# Patient Record
Sex: Female | Born: 1955
Health system: Southern US, Community
[De-identification: ages and names within clinical notes are randomized; demographics above are authoritative.]

## PROBLEM LIST (undated history)

## (undated) DIAGNOSIS — I10 Essential (primary) hypertension: Secondary | ICD-10-CM

## (undated) HISTORY — DX: Essential (primary) hypertension: I10

---

## 2007-01-23 ENCOUNTER — Ambulatory Visit: Payer: Self-pay | Admitting: Family Medicine

## 2007-01-24 ENCOUNTER — Encounter (INDEPENDENT_AMBULATORY_CARE_PROVIDER_SITE_OTHER): Payer: Self-pay | Admitting: Internal Medicine

## 2007-01-24 LAB — CONVERTED CEMR LAB
ALT: 10 units/L (ref 0–35)
AST: 16 units/L (ref 0–37)
Basophils Absolute: 0.1 10*3/uL (ref 0.0–0.1)
Basophils Relative: 1 % (ref 0–1)
Calcium: 10.1 mg/dL (ref 8.4–10.5)
Chloride: 109 meq/L (ref 96–112)
Creatinine, Ser: 0.83 mg/dL (ref 0.40–1.20)
MCHC: 32 g/dL (ref 30.0–36.0)
Monocytes Absolute: 0.4 10*3/uL (ref 0.1–1.0)
Neutro Abs: 2.7 10*3/uL (ref 1.7–7.7)
Neutrophils Relative %: 53 % (ref 43–77)
Platelets: 289 10*3/uL (ref 150–400)
Potassium: 5 meq/L (ref 3.5–5.3)
RDW: 13.2 % (ref 11.5–15.5)
Sodium: 144 meq/L (ref 135–145)
Total CHOL/HDL Ratio: 3.8

## 2007-01-25 ENCOUNTER — Ambulatory Visit (HOSPITAL_COMMUNITY): Admission: RE | Admit: 2007-01-25 | Discharge: 2007-01-25 | Payer: Self-pay | Admitting: Family Medicine

## 2007-02-03 ENCOUNTER — Ambulatory Visit: Payer: Self-pay | Admitting: *Deleted

## 2007-05-08 ENCOUNTER — Ambulatory Visit: Payer: Self-pay | Admitting: Internal Medicine

## 2008-01-15 ENCOUNTER — Ambulatory Visit: Payer: Self-pay | Admitting: Family Medicine

## 2008-01-26 ENCOUNTER — Ambulatory Visit (HOSPITAL_COMMUNITY): Admission: RE | Admit: 2008-01-26 | Discharge: 2008-01-26 | Payer: Self-pay | Admitting: Internal Medicine

## 2008-02-21 ENCOUNTER — Encounter (INDEPENDENT_AMBULATORY_CARE_PROVIDER_SITE_OTHER): Payer: Self-pay | Admitting: Adult Health

## 2008-02-21 ENCOUNTER — Ambulatory Visit: Payer: Self-pay | Admitting: Internal Medicine

## 2008-02-21 LAB — CONVERTED CEMR LAB
Albumin: 4.4 g/dL (ref 3.5–5.2)
BUN: 10 mg/dL (ref 6–23)
CO2: 22 meq/L (ref 19–32)
Calcium: 9.5 mg/dL (ref 8.4–10.5)
Chloride: 107 meq/L (ref 96–112)
Eosinophils Absolute: 0.2 10*3/uL (ref 0.0–0.7)
Glucose, Bld: 95 mg/dL (ref 70–99)
Lymphs Abs: 2.2 10*3/uL (ref 0.7–4.0)
MCV: 85.2 fL (ref 78.0–100.0)
Monocytes Relative: 7 % (ref 3–12)
Neutro Abs: 2.6 10*3/uL (ref 1.7–7.7)
Neutrophils Relative %: 49 % (ref 43–77)
Potassium: 4.3 meq/L (ref 3.5–5.3)
RBC: 4.47 M/uL (ref 3.87–5.11)
WBC: 5.4 10*3/uL (ref 4.0–10.5)

## 2008-12-17 ENCOUNTER — Ambulatory Visit: Payer: Self-pay | Admitting: Family Medicine

## 2013-02-01 ENCOUNTER — Emergency Department (HOSPITAL_COMMUNITY)
Admission: EM | Admit: 2013-02-01 | Discharge: 2013-02-01 | Disposition: A | Discharge status: Home / Self Care | Payer: No Typology Code available for payment source | Source: Home / Self Care | Attending: Family Medicine | Admitting: Family Medicine

## 2013-02-01 ENCOUNTER — Encounter (HOSPITAL_COMMUNITY): Payer: Self-pay | Admitting: Emergency Medicine

## 2013-02-01 DIAGNOSIS — R51 Headache: Secondary | ICD-10-CM

## 2013-02-01 DIAGNOSIS — IMO0001 Reserved for inherently not codable concepts without codable children: Secondary | ICD-10-CM

## 2013-02-01 DIAGNOSIS — I10 Essential (primary) hypertension: Secondary | ICD-10-CM

## 2013-02-01 DIAGNOSIS — Z0000 Encounter for general adult medical examination without abnormal findings: Secondary | ICD-10-CM

## 2013-02-01 DIAGNOSIS — R030 Elevated blood-pressure reading, without diagnosis of hypertension: Secondary | ICD-10-CM

## 2013-02-01 DIAGNOSIS — R519 Headache, unspecified: Secondary | ICD-10-CM

## 2013-02-01 LAB — POCT URINALYSIS DIP (DEVICE)
BILIRUBIN URINE: NEGATIVE
GLUCOSE, UA: NEGATIVE mg/dL
HGB URINE DIPSTICK: NEGATIVE
KETONES UR: NEGATIVE mg/dL
NITRITE: NEGATIVE
PH: 7 (ref 5.0–8.0)
Protein, ur: NEGATIVE mg/dL
SPECIFIC GRAVITY, URINE: 1.015 (ref 1.005–1.030)
Urobilinogen, UA: 0.2 mg/dL (ref 0.0–1.0)

## 2013-02-01 MED ORDER — HYDROCHLOROTHIAZIDE 25 MG PO TABS: 12.5000 mg | ORAL_TABLET | Freq: Every day | ORAL | Status: DC

## 2013-02-01 NOTE — ED Provider Notes (Signed)
CSN: [id number]     Arrival date & time 02/01/13  0840 History   First MD Initiated Contact with Patient 02/01/13 [phone]     Chief Complaint  Patient presents with  . Headache   (Consider location/radiation/quality/duration/timing/severity/associated sxs/prior Treatment) HPI Comments: Patient is asymptomatic at time of visit. Recently immigrated to the [place]. No PCP. Denies fever, corrective lens use, injuries, N/V, dizziness, changes in speech. Headaches only occur during daytime and never wake patient from sleep. Symptoms resolve completely with use of Advil.   Patient is a 58 y.o. female presenting with headaches. The history is provided by the patient and a relative. The history is limited by a language barrier. A language interpreter was used (family memeber).  Headache Pain location:  Frontal Quality: +throbbing. Radiates to:  Does not radiate Severity currently:  0/10 Onset quality:  Gradual Progression:  Waxing and waning Context: not activity, not exposure to bright light, not caffeine, not coughing, not defecating, not eating, not stress, not exposure to cold air, not intercourse, not loud noise and not straining   Relieved by:  NSAIDs Associated symptoms: blurred vision   Associated symptoms: no abdominal pain, no back pain, no congestion, no cough, no diarrhea, no dizziness, no drainage, no ear pain, no pain, no facial pain, no fatigue, no fever, no focal weakness, no hearing loss, no loss of balance, no myalgias, no nausea, no near-syncope, no neck pain, no neck stiffness, no numbness, no paresthesias, no photophobia, no seizures, no sinus pressure, no sore throat, no swollen glands, no syncope, no tingling, no URI, no visual change, no vomiting and no weakness     History reviewed. No pertinent past medical history. History reviewed. No pertinent past surgical history. No family history on file. History  Substance Use Topics  . Smoking status: Never Smoker   . Smokeless  tobacco: Not on file  . Alcohol Use: No   OB History   Grav Para Term Preterm Abortions TAB SAB Ect Mult Living                 Review of Systems  Constitutional: Negative for fever and fatigue.  HENT: Negative for congestion, ear pain, hearing loss, postnasal drip, sinus pressure and sore throat.   Eyes: Positive for blurred vision. Negative for photophobia and pain.  Respiratory: Negative for cough.   Cardiovascular: Negative for syncope and near-syncope.  Gastrointestinal: Negative for nausea, vomiting, abdominal pain and diarrhea.  Musculoskeletal: Negative for back pain, myalgias, neck pain and neck stiffness.  Neurological: Positive for headaches. Negative for dizziness, focal weakness, seizures, numbness, paresthesias and loss of balance.  All other systems reviewed and are negative.    Allergies  Review of patient's allergies indicates no known allergies.  Home Medications  No current outpatient prescriptions on file. BP 153/94  Pulse 86  Temp(Src) 98.2 F (36.8 C) (Oral)  Resp 16  SpO2 97% Physical Exam  Nursing note and vitals reviewed. Constitutional: She is oriented to person, place, and time. She appears well-developed and well-nourished. No distress.  HENT:  Head: Normocephalic and atraumatic.  Right Ear: Hearing, tympanic membrane, external ear and ear canal normal.  Left Ear: Hearing, tympanic membrane, external ear and ear canal normal.  Nose: Nose normal.  Mouth/Throat: Uvula is midline, oropharynx is clear and moist and mucous membranes are normal.  Eyes: Conjunctivae and EOM are normal. Pupils are equal, round, and reactive to light. Right eye exhibits discharge. Left eye exhibits no discharge. No scleral icterus.  Fundoscopic exam:  The right eye shows no arteriolar narrowing, no AV nicking, no exudate, no hemorrhage and no papilledema.       The left eye shows no AV nicking, no exudate, no hemorrhage and no papilledema.  Neck: Normal range of  motion. Neck supple. No thyromegaly present.  Cardiovascular: Normal rate, regular rhythm and normal heart sounds.   Pulmonary/Chest: Effort normal and breath sounds normal. No respiratory distress.  Abdominal: Soft. Bowel sounds are normal. She exhibits no distension. There is no tenderness.  Musculoskeletal: Normal range of motion.  Lymphadenopathy:    She has no cervical adenopathy.  Neurological: She is alert and oriented to person, place, and time. She has normal strength. No cranial nerve deficit. Coordination and gait normal. GCS eye subscore is 4. GCS verbal subscore is 5. GCS motor subscore is 6.  Skin: Skin is warm and dry. No rash noted.  Psychiatric: She has a normal mood and affect. Her behavior is normal.    ED Course  Procedures (including critical care time) Labs Review Labs Reviewed  POCT URINALYSIS DIP (DEVICE) - Abnormal; Notable for the following:    Leukocytes, UA SMALL (*)    All other components within normal limits   Imaging Review No results found.  EKG Interpretation    Date/Time:    Ventricular Rate:    PR Interval:    QRS Duration:   QT Interval:    QTC Calculation:   R Axis:     Text Interpretation:              MDM  Hx and exam suggest non-specific or tension type headache. Advised of elevated BP. Will provide limited Rx for HCTZ 12.5 mg QD and resource information for [facility] for follow up.     Jess Barters [place], [place] 02/01/13 1024  [address] [place], [place] 02/01/13 1254

## 2013-02-01 NOTE — ED Notes (Signed)
Pt c/o intermittent HA onset 1 month... HA will usually last for 2-3 days and after taking advil will feel better Also has blurry vision when she has the HA Denies: weakness, numbness, slurry speech  She is alert w/no signs of acute distress.

## 2013-02-02 NOTE — ED Provider Notes (Signed)
Medical screening examination/treatment/procedure(s) were performed by a resident physician or non-physician practitioner and as the supervising physician I was immediately available for consultation/collaboration.  Evan Corey, MD    Evan S Corey, MD 02/02/13 0752 

## 2013-02-20 ENCOUNTER — Encounter: Payer: Self-pay | Admitting: Internal Medicine

## 2013-02-20 ENCOUNTER — Ambulatory Visit: Payer: No Typology Code available for payment source | Attending: Internal Medicine | Admitting: Internal Medicine

## 2013-02-20 VITALS — BP 150/107 | HR 95 | Temp 98.7°F | Resp 14 | Ht 61.0 in | Wt 132.0 lb

## 2013-02-20 DIAGNOSIS — R51 Headache: Secondary | ICD-10-CM | POA: Insufficient documentation

## 2013-02-20 DIAGNOSIS — I10 Essential (primary) hypertension: Secondary | ICD-10-CM | POA: Insufficient documentation

## 2013-02-20 DIAGNOSIS — Z0000 Encounter for general adult medical examination without abnormal findings: Secondary | ICD-10-CM

## 2013-02-20 LAB — COMPLETE METABOLIC PANEL WITH GFR
ALBUMIN: 4.3 g/dL (ref 3.5–5.2)
ALT: 13 U/L (ref 0–35)
AST: 17 U/L (ref 0–37)
Alkaline Phosphatase: 55 U/L (ref 39–117)
BILIRUBIN TOTAL: 0.7 mg/dL (ref 0.2–1.2)
BUN: 11 mg/dL (ref 6–23)
CO2: 28 meq/L (ref 19–32)
Calcium: 9.4 mg/dL (ref 8.4–10.5)
Chloride: 106 mEq/L (ref 96–112)
Creat: 0.79 mg/dL (ref 0.50–1.10)
GFR, EST NON AFRICAN AMERICAN: 82 mL/min
GLUCOSE: 100 mg/dL — AB (ref 70–99)
POTASSIUM: 3.9 meq/L (ref 3.5–5.3)
SODIUM: 141 meq/L (ref 135–145)
TOTAL PROTEIN: 7.4 g/dL (ref 6.0–8.3)

## 2013-02-20 LAB — CBC WITH DIFFERENTIAL/PLATELET
Basophils Absolute: 0 10*3/uL (ref 0.0–0.1)
Basophils Relative: 1 % (ref 0–1)
EOS ABS: 0 10*3/uL (ref 0.0–0.7)
Eosinophils Relative: 1 % (ref 0–5)
HEMATOCRIT: 36.9 % (ref 36.0–46.0)
HEMOGLOBIN: 12.5 g/dL (ref 12.0–15.0)
LYMPHS ABS: 2 10*3/uL (ref 0.7–4.0)
Lymphocytes Relative: 43 % (ref 12–46)
MCH: 28.3 pg (ref 26.0–34.0)
MCHC: 33.9 g/dL (ref 30.0–36.0)
MCV: 83.7 fL (ref 78.0–100.0)
MONO ABS: 0.3 10*3/uL (ref 0.1–1.0)
MONOS PCT: 7 % (ref 3–12)
NEUTROS PCT: 48 % (ref 43–77)
Neutro Abs: 2.4 10*3/uL (ref 1.7–7.7)
Platelets: 312 10*3/uL (ref 150–400)
RBC: 4.41 MIL/uL (ref 3.87–5.11)
RDW: 13.5 % (ref 11.5–15.5)
WBC: 4.8 10*3/uL (ref 4.0–10.5)

## 2013-02-20 LAB — TSH: TSH: 2.283 u[IU]/mL (ref 0.350–4.500)

## 2013-02-20 MED ORDER — HYDROCHLOROTHIAZIDE 25 MG PO TABS: 25.0000 mg | ORAL_TABLET | Freq: Every day | ORAL | Status: DC

## 2013-02-20 NOTE — Progress Notes (Addendum)
Patient ID: Ashley Pena, female   DOB: 07-22-1955, 58 y.o.   MRN: [id number]   CC:  HPI: 58 year old female comes in to establish care, she has a history of hypertension, has not seen a physician in 4-5 years. Recently seen in the ER on 1/15 with frontal headaches, blood pressure was in the 150s. She was started on hydrochlorothiazide. Denies any headache today Poor communication due to language barrier. She denies any other medical problems She has had one C-section  Social history nonsmoker nonalcoholic Family history mother had fever of unknown origin as per the patient    No Known Allergies History reviewed. No pertinent past medical history. No current outpatient prescriptions on file prior to visit.   No current facility-administered medications on file prior to visit.   History reviewed. No pertinent family history. History   Social History  . Marital Status: Single    Spouse Name: N/A    Number of Children: N/A  . Years of Education: N/A   Occupational History  . Not on file.   Social History Main Topics  . Smoking status: Never Smoker   . Smokeless tobacco: Not on file  . Alcohol Use: No  . Drug Use: No  . Sexual Activity: Not on file   Other Topics Concern  . Not on file   Social History Narrative  . No narrative on file    Review of Systems  Constitutional: Negative for fever, chills, diaphoresis, activity change, appetite change and fatigue.  HENT: Negative for ear pain, nosebleeds, congestion, facial swelling, rhinorrhea, neck pain, neck stiffness and ear discharge.   Eyes: Negative for pain, discharge, redness, itching and visual disturbance.  Respiratory: Negative for cough, choking, chest tightness, shortness of breath, wheezing and stridor.   Cardiovascular: Negative for chest pain, palpitations and leg swelling.  Gastrointestinal: Negative for abdominal distention.  Genitourinary: Negative for dysuria, urgency, frequency, hematuria, flank pain,  decreased urine volume, difficulty urinating and dyspareunia.  Musculoskeletal: Negative for back pain, joint swelling, arthralgias and gait problem.  Neurological: Negative for dizziness, tremors, seizures, syncope, facial asymmetry, speech difficulty, weakness, light-headedness, numbness and headaches.  Hematological: Negative for adenopathy. Does not bruise/bleed easily.  Psychiatric/Behavioral: Negative for hallucinations, behavioral problems, confusion, dysphoric mood, decreased concentration and agitation.    Objective:   Filed Vitals:   02/20/13 0916  BP: 150/107  Pulse: 95  Temp: 98.7 F (37.1 C)  Resp: 14    Physical Exam  Constitutional: Appears well-developed and well-nourished. No distress.  HENT: Normocephalic. External right and left ear normal. Oropharynx is clear and moist.  Eyes: Conjunctivae and EOM are normal. PERRLA, no scleral icterus.  Neck: Normal ROM. Neck supple. No JVD. No tracheal deviation. No thyromegaly.  CVS: RRR, S1/S2 +, no murmurs, no gallops, no carotid bruit.  Pulmonary: Effort and breath sounds normal, no stridor, rhonchi, wheezes, rales.  Abdominal: Soft. BS +,  no distension, tenderness, rebound or guarding.  Musculoskeletal: Normal range of motion. No edema and no tenderness.  Lymphadenopathy: No lymphadenopathy noted, cervical, inguinal. Neuro: Alert. Normal reflexes, muscle tone coordination. No cranial nerve deficit. Skin: Skin is warm and dry. No rash noted. Not diaphoretic. No erythema. No pallor.  Psychiatric: Normal mood and affect. Behavior, judgment, thought content normal.   Lab Results  Component Value Date   WBC 5.4 02/21/2008   HGB 12.3 02/21/2008   HCT 38.1 02/21/2008   MCV 85.2 02/21/2008   PLT 259 02/21/2008   Lab Results  Component Value Date   CREATININE  0.73 02/21/2008   BUN 10 02/21/2008   NA 142 02/21/2008   K 4.3 02/21/2008   CL 107 02/21/2008   CO2 22 02/21/2008    No results found for this basename: HGBA1C   Lipid Panel      Component Value Date/Time   CHOL 211* 01/24/2007 0102   TRIG 122 01/24/2007 0102   HDL 55 01/24/2007 0102   CHOLHDL 3.8 Ratio 01/24/2007 0102   VLDL 24 01/24/2007 0102   LDLCALC 132* 01/24/2007 0102       Assessment and plan:   There are no active problems to display for this patient.      Hypertension Increase HCTZ to 25 mg Obtain baseline labs, renal panel   Headache No focal symptoms therefore  No neuro imaging is being obtained, likely secondary to hypertension   Establish care Last colonoscopy reportedly was in 2005 Gastroenterology referral her for colonoscopy Obtain baseline labs Gynecology referral for a Pap smear  Follow up in 3 months    The patient was given clear instructions to go to ER or return to medical center if symptoms don't improve, worsen or new problems develop. The patient verbalized understanding. The patient was told to call to get any lab results if not heard anything in the next week.

## 2013-02-20 NOTE — Progress Notes (Signed)
Pt is here to establish care. Pt suffers from hypertension. Complains of throbbing headaches x2 month. Takes Advil for pain.

## 2013-02-21 LAB — VITAMIN D 25 HYDROXY (VIT D DEFICIENCY, FRACTURES): Vit D, 25-Hydroxy: 36 ng/mL (ref 30–89)

## 2013-02-22 ENCOUNTER — Telehealth: Payer: Self-pay | Admitting: *Deleted

## 2013-02-22 NOTE — Telephone Encounter (Signed)
Unable to leave pt a voicemail. No answer.

## 2013-02-22 NOTE — Telephone Encounter (Signed)
Message copied by Gazella Anglin, [place] on Thu Feb 22, 2013  3:44 PM ------      Message from: Susie CassetteABROL MD, [facility]      Created: Wed Feb 21, 2013  9:57 AM       Notify patient of the labs are normal ------

## 2013-04-12 ENCOUNTER — Ambulatory Visit
Admission: RE | Admit: 2013-04-12 | Discharge: 2013-04-12 | Disposition: A | Discharge status: Home / Self Care | Payer: No Typology Code available for payment source | Source: Ambulatory Visit | Attending: Internal Medicine | Admitting: Internal Medicine

## 2013-04-12 ENCOUNTER — Ambulatory Visit: Payer: Self-pay

## 2013-04-12 ENCOUNTER — Telehealth: Payer: Self-pay | Admitting: Emergency Medicine

## 2013-04-12 DIAGNOSIS — I10 Essential (primary) hypertension: Secondary | ICD-10-CM

## 2013-04-12 DIAGNOSIS — Z0000 Encounter for general adult medical examination without abnormal findings: Secondary | ICD-10-CM

## 2013-04-12 NOTE — Telephone Encounter (Signed)
Pt daughter per interpretor given mammogram results. Verbalized understanding

## 2013-04-12 NOTE — Telephone Encounter (Signed)
Message copied by Darlis LoanSMITH, Alleene Stoy D on Thu Apr 12, 2013  4:53 PM ------      Message from: Susie CassetteABROL MD, Germain OsgoodNAYANA      Created: Thu Apr 12, 2013  3:53 PM       No mammographic evidence of malignancy ------

## 2013-05-21 ENCOUNTER — Ambulatory Visit: Payer: Self-pay | Admitting: Internal Medicine

## 2013-06-01 ENCOUNTER — Ambulatory Visit: Payer: Self-pay | Attending: Internal Medicine | Admitting: Internal Medicine

## 2013-06-01 ENCOUNTER — Encounter: Payer: Self-pay | Admitting: Internal Medicine

## 2013-06-01 VITALS — BP 133/78 | HR 80 | Temp 98.2°F | Resp 16 | Wt 131.0 lb

## 2013-06-01 DIAGNOSIS — Z79899 Other long term (current) drug therapy: Secondary | ICD-10-CM | POA: Insufficient documentation

## 2013-06-01 DIAGNOSIS — I10 Essential (primary) hypertension: Secondary | ICD-10-CM

## 2013-06-01 MED ORDER — HYDROCHLOROTHIAZIDE 25 MG PO TABS: 25.0000 mg | ORAL_TABLET | Freq: Every day | ORAL | Status: DC

## 2013-06-01 NOTE — Progress Notes (Signed)
Patient ID: Ashley Pena, female   DOB: 11-25-1955, 58 y.o.   MRN: [id number]  CC: F/u of HTN  HPI:  Patient presents to clinic today for a routine follow up of her hypertension.  Patient is accompanied by her son for minor interpretation.  Patient reports that she was unaware that she needed to continue taking HCTZ after the medication ran out.  No one explained that this will be a long term medication and she will need refills.  Patient reports that she understands now and will continue with medication management.   No Known Allergies Past Medical History  Diagnosis Date  . Hypertension    Current Outpatient Prescriptions on File Prior to Visit  Medication Sig Dispense Refill  . hydrochlorothiazide (HYDRODIURIL) 25 MG tablet Take 1 tablet (25 mg total) by mouth daily.  30 tablet  3   No current facility-administered medications on file prior to visit.   History reviewed. No pertinent family history. History   Social History  . Marital Status: Single    Spouse Name: N/A    Number of Children: N/A  . Years of Education: N/A   Occupational History  . Not on file.   Social History Main Topics  . Smoking status: Never Smoker   . Smokeless tobacco: Not on file  . Alcohol Use: No  . Drug Use: No  . Sexual Activity: Not on file   Other Topics Concern  . Not on file   Social History Narrative  . No narrative on file   Review of Systems  HENT: Negative.   Eyes: Negative.   Respiratory: Negative.   Cardiovascular: Negative.   Gastrointestinal: Negative.       Objective:   Filed Vitals:   06/01/13 1006  BP: 133/78  Pulse: 80  Temp: 98.2 F (36.8 C)  Resp: 16   Physical Exam  Constitutional: She appears well-developed and well-nourished.  Eyes: Pupils are equal, round, and reactive to light. No scleral icterus.  Neck: Normal range of motion. Neck supple. No JVD present.  Cardiovascular: Normal rate, regular rhythm, normal heart sounds and intact distal pulses.    Pulmonary/Chest: Effort normal and breath sounds normal.  Abdominal: Soft. Bowel sounds are normal.  Musculoskeletal: She exhibits no edema and no tenderness.  Lymphadenopathy:    She has no cervical adenopathy.  Skin: Skin is warm and dry.  Psychiatric: She has a normal mood and affect. Her behavior is normal.     Lab Results  Component Value Date   WBC 4.8 02/20/2013   HGB 12.5 02/20/2013   HCT 36.9 02/20/2013   MCV 83.7 02/20/2013   PLT 312 02/20/2013   Lab Results  Component Value Date   CREATININE 0.79 02/20/2013   BUN 11 02/20/2013   NA 141 02/20/2013   K 3.9 02/20/2013   CL 106 02/20/2013   CO2 28 02/20/2013    No results found for this basename: HGBA1C   Lipid Panel     Component Value Date/Time   CHOL 211* 01/24/2007 0102   TRIG 122 01/24/2007 0102   HDL 55 01/24/2007 0102   CHOLHDL 3.8 Ratio 01/24/2007 0102   VLDL 24 01/24/2007 0102   LDLCALC 132* 01/24/2007 0102       Assessment and plan:   Ashley Pena was seen today for follow-up and hypertension.  Diagnoses and associated orders for this visit:  HTN (hypertension) Continue current regimen and dash diet - hydrochlorothiazide (HYDRODIURIL) 25 MG tablet; Take 1 tablet (25 mg total) by mouth  daily.   Return in about 3 months (around 09/01/2013) for htn.   Holland CommonsValerie Keck, NP-C [facility] [phone] 06/01/2013, 10:30 AM

## 2013-06-01 NOTE — Patient Instructions (Signed)
Hypertension  As your heart beats, it forces blood through your arteries. This force is your blood pressure. If the pressure is too high, it is called hypertension (HTN) or high blood pressure. HTN is dangerous because you may have it and not know it. High blood pressure may mean that your heart has to work harder to pump blood. Your arteries may be narrow or stiff. The extra work puts you at risk for heart disease, stroke, and other problems.   Blood pressure consists of two numbers, a higher number over a lower, 110/72, for example. It is stated as "110 over 72." The ideal is below 120 for the top number (systolic) and under 80 for the bottom (diastolic). Write down your blood pressure today.  You should pay close attention to your blood pressure if you have certain conditions such as:   Heart failure.   Prior heart attack.   Diabetes   Chronic kidney disease.   Prior stroke.   Multiple risk factors for heart disease.  To see if you have HTN, your blood pressure should be measured while you are seated with your arm held at the level of the heart. It should be measured at least twice. A one-time elevated blood pressure reading (especially in the Emergency Department) does not mean that you need treatment. There may be conditions in which the blood pressure is different between your right and left arms. It is important to see your caregiver soon for a recheck.  Most people have essential hypertension which means that there is not a specific cause. This type of high blood pressure may be lowered by changing lifestyle factors such as:   Stress.   Smoking.   Lack of exercise.   Excessive weight.   Drug/tobacco/alcohol use.   Eating less salt.  Most people do not have symptoms from high blood pressure until it has caused damage to the body. Effective treatment can often prevent, delay or reduce that damage.  TREATMENT    When a cause has been identified, treatment for high blood pressure is directed at the cause. There are a large number of medications to treat HTN. These fall into several categories, and your caregiver will help you select the medicines that are best for you. Medications may have side effects. You should review side effects with your caregiver.  If your blood pressure stays high after you have made lifestyle changes or started on medicines,    Your medication(s) may need to be changed.   Other problems may need to be addressed.   Be certain you understand your prescriptions, and know how and when to take your medicine.   Be sure to follow up with your caregiver within the time frame advised (usually within two weeks) to have your blood pressure rechecked and to review your medications.   If you are taking more than one medicine to lower your blood pressure, make sure you know how and at what times they should be taken. Taking two medicines at the same time can result in blood pressure that is too low.  SEEK IMMEDIATE MEDICAL CARE IF:   You develop a severe headache, blurred or changing vision, or confusion.   You have unusual weakness or numbness, or a faint feeling.   You have severe chest or abdominal pain, vomiting, or breathing problems.  MAKE SURE YOU:    Understand these instructions.   Will watch your condition.   Will get help right away if you are not doing well   or get worse.  Document Released: 01/04/2005 Document Revised: 03/29/2011 Document Reviewed: 08/25/2007  ExitCare Patient Information 2014 [employer].  DASH Diet   The DASH diet stands for "Dietary Approaches to Stop Hypertension." It is a healthy eating plan that has been shown to reduce high blood pressure (hypertension) in as little as 14 days, while also possibly providing other significant health benefits. These other health benefits include reducing the risk of breast cancer after menopause and reducing the risk of type 2 diabetes, heart disease, colon cancer, and stroke. Health benefits also include weight loss and slowing kidney failure in patients with chronic kidney disease.   DIET GUIDELINES   Limit salt (sodium). Your diet should contain less than 1500 mg of sodium daily.   Limit refined or processed carbohydrates. Your diet should include mostly whole grains. Desserts and added sugars should be used sparingly.   Include small amounts of heart-healthy fats. These types of fats include nuts, oils, and tub margarine. Limit saturated and trans fats. These fats have been shown to be harmful in the body.  CHOOSING FOODS   The following food groups are based on a 2000 calorie diet. See your Registered Dietitian for individual calorie needs.  Grains and Grain Products (6 to 8 servings daily)   Eat More Often: Whole-wheat bread, brown rice, whole-grain or wheat pasta, quinoa, popcorn without added fat or salt (air popped).   Eat Less Often: White bread, white pasta, white rice, cornbread.  Vegetables (4 to 5 servings daily)   Eat More Often: Fresh, frozen, and canned vegetables. Vegetables may be raw, steamed, roasted, or grilled with a minimal amount of fat.   Eat Less Often/Avoid: Creamed or fried vegetables. Vegetables in a cheese sauce.  Fruit (4 to 5 servings daily)   Eat More Often: All fresh, canned (in natural juice), or frozen fruits. Dried fruits without added sugar. One hundred percent fruit juice ( cup [237 mL] daily).   Eat Less Often: Dried fruits with added sugar. Canned fruit in light or heavy syrup.   Lean Meats, Fish, and Poultry (2 servings or less daily. One serving is 3 to 4 oz [85-114 g]).   Eat More Often: Ninety percent or leaner ground beef, tenderloin, sirloin. Round cuts of beef, chicken breast, turkey breast. All fish. Grill, bake, or broil your meat. Nothing should be fried.   Eat Less Often/Avoid: Fatty cuts of meat, turkey, or chicken leg, thigh, or wing. Fried cuts of meat or fish.  Dairy (2 to 3 servings)   Eat More Often: Low-fat or fat-free milk, low-fat plain or light yogurt, reduced-fat or part-skim cheese.   Eat Less Often/Avoid: Milk (whole, 2%).Whole milk yogurt. Full-fat cheeses.  Nuts, Seeds, and Legumes (4 to 5 servings per week)   Eat More Often: All without added salt.   Eat Less Often/Avoid: Salted nuts and seeds, canned beans with added salt.  Fats and Sweets (limited)   Eat More Often: Vegetable oils, tub margarines without trans fats, sugar-free gelatin. Mayonnaise and salad dressings.   Eat Less Often/Avoid: Coconut oils, palm oils, butter, stick margarine, cream, half and half, cookies, candy, pie.  FOR MORE INFORMATION  The Dash Diet Eating Plan: www.dashdiet.org  Document Released: 12/24/2010 Document Revised: 03/29/2011 Document Reviewed: 12/24/2010  ExitCare Patient Information 2014 [employer].

## 2013-06-01 NOTE — Progress Notes (Signed)
Patient is here to F/U for HTN States she has not taken her BP med in 3 weeks. Sates she didn't know she was to continue it. She and son re-educated on importance of continuing med. Both verbalize understanding. Son is present to asist with interpretation.

## 2014-10-09 ENCOUNTER — Ambulatory Visit: Payer: Self-pay | Attending: Internal Medicine | Admitting: Internal Medicine

## 2014-10-09 ENCOUNTER — Encounter: Payer: Self-pay | Admitting: Internal Medicine

## 2014-10-09 VITALS — BP 168/93 | HR 87 | Temp 98.0°F | Resp 16 | Ht 61.0 in | Wt 123.6 lb

## 2014-10-09 DIAGNOSIS — I10 Essential (primary) hypertension: Secondary | ICD-10-CM | POA: Insufficient documentation

## 2014-10-09 DIAGNOSIS — Z23 Encounter for immunization: Secondary | ICD-10-CM | POA: Insufficient documentation

## 2014-10-09 MED ORDER — HYDROCHLOROTHIAZIDE 25 MG PO TABS: 25.0000 mg | ORAL_TABLET | Freq: Every day | ORAL | Status: DC

## 2014-10-09 NOTE — Progress Notes (Signed)
Patient states she is here for a check up Patient presents in office with elevated blood pressure Patient stated she used to take medications for HTN but does not Take it any more

## 2014-10-09 NOTE — Progress Notes (Signed)
Patient ID: Ashley Pena, female   DOB: 1955/09/18, 60 y.o.   MRN: [id number] Subjective:  Ashley Pena is a 59 y.o. female with hypertension. Current Outpatient Prescriptions  Medication Sig Dispense Refill  . ibuprofen (ADVIL,MOTRIN) 200 MG tablet Take 200 mg by mouth every 6 (six) hours as needed for moderate pain.    . hydrochlorothiazide (HYDRODIURIL) 25 MG tablet Take 1 tablet (25 mg total) by mouth daily. (Patient not taking: Reported on 10/09/2014) 30 tablet 3   No current facility-administered medications for this visit.    Hypertension ROS: taking medications as instructed, no medication side effects noted, no TIA's, no chest pain on exertion, no dyspnea on exertion, no swelling of ankles and no palpitations.   Objective:  BP 168/93 mmHg  Pulse 87  Temp(Src) 98 F (36.7 C)  Resp 16  Ht  (1.549 m)  Wt 123 lb 9.6 oz (56.065 kg)  BMI 23.37 kg/m2  SpO2 100%  Appearance alert, well appearing, and in no distress, oriented to person, place, and time and normal appearing weight. General exam BP noted to be mildly elevated today in office, S1, S2 normal, no gallop, no murmur, chest clear, no JVD, no HSM, no edema.  Lab review: orders written for new lab studies as appropriate; see orders.   Assessment:   Hypertension needs improvement and patient poorly compliant.  Need influenza vaccine: give today  Plan:  Recommended sodium restriction. Copy of written low fat low cholesterol diet provided and reviewed. Reviewed medications and side effects in detail.    Return in about 4 weeks (around 11/06/2014) for Nurse Visit-BP check/Lab visit and 3 mo PCP HTN .    [name], NP 10/09/2014 3:45 PM

## 2015-06-30 ENCOUNTER — Emergency Department (HOSPITAL_COMMUNITY)
Admission: EM | Admit: 2015-06-30 | Discharge: 2015-06-30 | Disposition: A | Discharge status: Home / Self Care | Payer: Self-pay | Attending: Emergency Medicine | Admitting: Emergency Medicine

## 2015-06-30 ENCOUNTER — Emergency Department (HOSPITAL_COMMUNITY): Payer: Self-pay

## 2015-06-30 ENCOUNTER — Encounter (HOSPITAL_COMMUNITY): Payer: Self-pay | Admitting: Vascular Surgery

## 2015-06-30 DIAGNOSIS — R51 Headache: Secondary | ICD-10-CM | POA: Insufficient documentation

## 2015-06-30 DIAGNOSIS — Z79899 Other long term (current) drug therapy: Secondary | ICD-10-CM | POA: Insufficient documentation

## 2015-06-30 DIAGNOSIS — R519 Headache, unspecified: Secondary | ICD-10-CM

## 2015-06-30 DIAGNOSIS — I10 Essential (primary) hypertension: Secondary | ICD-10-CM | POA: Insufficient documentation

## 2015-06-30 MED ORDER — METOCLOPRAMIDE HCL 5 MG/ML IJ SOLN
10.0000 mg | Freq: Once | INTRAMUSCULAR | Status: AC
  Administered 2015-06-30: 10 mg via INTRAVENOUS
  Filled 2015-06-30: qty 2

## 2015-06-30 MED ORDER — DIPHENHYDRAMINE HCL 50 MG/ML IJ SOLN
25.0000 mg | Freq: Once | INTRAMUSCULAR | Status: AC
  Administered 2015-06-30: 25 mg via INTRAVENOUS
  Filled 2015-06-30: qty 1

## 2015-06-30 MED ORDER — DEXAMETHASONE SODIUM PHOSPHATE 10 MG/ML IJ SOLN
10.0000 mg | Freq: Once | INTRAMUSCULAR | Status: AC
  Administered 2015-06-30: 10 mg via INTRAVENOUS
  Filled 2015-06-30: qty 1

## 2015-06-30 NOTE — Discharge Instructions (Signed)

## 2015-06-30 NOTE — ED Provider Notes (Signed)
CSN: [id number]     Arrival date & time 06/30/15  1213 History   First MD Initiated Contact with Patient 06/30/15 1627     Patient interviewed with family at bedside who provided an assisted with communication. She declines formal interpreter.  Chief Complaint  Patient presents with  . Headache   Patient is a 60 y.o. female presenting with headaches.  Headache Pain location:  Generalized Quality:  Dull Radiates to:  Does not radiate Severity currently:  10/10 Severity at highest:  10/10 Onset quality:  Gradual Duration:  3 days Timing:  Constant Progression:  Worsening Chronicity:  Recurrent Similar to prior headaches: yes   Context: not activity, not exposure to bright light, not coughing, not eating, not stress and not loud noise   Relieved by:  Nothing Worsened by:  Nothing Ineffective treatments:  Acetaminophen Associated symptoms: facial pain   Associated symptoms: no abdominal pain, no back pain, no blurred vision, no congestion, no cough, no diarrhea, no dizziness, no ear pain, no fever, no near-syncope, no neck pain, no neck stiffness, no numbness, no paresthesias, no photophobia, no seizures, no sore throat, no swollen glands, no tingling, no URI, no visual change, no vomiting and no weakness      Past Medical History  Diagnosis Date  . Hypertension    Past Surgical History  Procedure Laterality Date  . Cesarean section     No family history on file. Social History  Substance Use Topics  . Smoking status: Never Smoker   . Smokeless tobacco: None  . Alcohol Use: No   OB History    No data available     Review of Systems  Constitutional: Negative for fever.  HENT: Negative for congestion, ear pain and sore throat.   Eyes: Negative for blurred vision and photophobia.  Respiratory: Negative for cough.   Cardiovascular: Negative for chest pain and near-syncope.  Gastrointestinal: Negative for vomiting, abdominal pain and diarrhea.  Musculoskeletal: Negative  for back pain, neck pain and neck stiffness.  Allergic/Immunologic: Negative for immunocompromised state.  Neurological: Positive for headaches. Negative for dizziness, seizures, weakness, numbness and paresthesias.  All other systems reviewed and are negative.     Allergies  Review of patient's allergies indicates no known allergies.  Home Medications   Prior to Admission medications   Medication Sig Start Date End Date Taking? Authorizing Provider  hydrochlorothiazide (HYDRODIURIL) 25 MG tablet Take 1 tablet (25 mg total) by mouth daily. 10/09/14   Ambrose FinlandValerie A Keck, NP  ibuprofen (ADVIL,MOTRIN) 200 MG tablet Take 200 mg by mouth every 6 (six) hours as needed for moderate pain.    Historical Provider, MD   BP 138/104 mmHg  Pulse 109  Temp(Src) 98.7 F (37.1 C) (Oral)  Resp 20  SpO2 98% Physical Exam  Constitutional: She is oriented to person, place, and time. She appears well-developed and well-nourished. No distress.  HENT:  Head: Normocephalic and atraumatic.  Right Ear: External ear normal.  Left Ear: External ear normal.  Nose: Nose normal.  Mouth/Throat: Oropharynx is clear and moist. No oropharyngeal exudate.  Eyes: Conjunctivae and EOM are normal. Right eye exhibits no discharge. Left eye exhibits no discharge. No scleral icterus.  Neck: Normal range of motion. Neck supple.  Cardiovascular: Normal rate, regular rhythm, normal heart sounds and intact distal pulses.   Pulmonary/Chest: Effort normal and breath sounds normal. No respiratory distress. She exhibits no tenderness.  Abdominal: Soft. Bowel sounds are normal. She exhibits no distension and no mass. There is  no tenderness. There is no rebound and no guarding.  Musculoskeletal: She exhibits no edema.  Lymphadenopathy:    She has no cervical adenopathy.  Neurological: She is alert and oriented to person, place, and time. No cranial nerve deficit. She exhibits normal muscle tone. Coordination normal.  Skin: Skin is  warm. No rash noted.  Psychiatric: She has a normal mood and affect.  Nursing note and vitals reviewed. Ambulates without difficulty, negative Romberg, 5 out of 5 strength and sensation on all 5 extremities, normal finger to nose  ED Course  Procedures (including critical care time) Labs Review Labs Reviewed - No data to display  Imaging Review Ct Head Wo Contrast  06/30/2015  CLINICAL DATA:  60 year old female with headache for 3 days. Light and sound sensitive. Reports symptoms similar to prior migraines. Initial encounter. EXAM: CT HEAD WITHOUT CONTRAST TECHNIQUE: Contiguous axial images were obtained from the base of the skull through the vertex without intravenous contrast. COMPARISON:  None. FINDINGS: Visible paranasal sinuses and mastoids are clear. Visible skull is within normal limits. Visualized orbit soft tissues are within normal limits. Visualized scalp soft tissues are within normal limits. Mild Calcified atherosclerosis at the skull base. Cerebral volume is within normal limits for age. No midline shift, ventriculomegaly, mass effect, evidence of mass lesion, intracranial hemorrhage or evidence of cortically based acute infarction. Gray-white matter differentiation is within normal limits throughout the brain. There is also calcified atherosclerosis of the right M1 MCA segment. No suspicious intracranial vascular hyperdensity. IMPRESSION: Negative for age noncontrast CT appearance of the brain. Electronically Signed   By: Odessa Fleming M.D.   On: 06/30/2015 17:30   I have personally reviewed and evaluated these images and lab results as part of my medical decision-making.   EKG Interpretation None      MDM   Final diagnoses:  Acute nonintractable headache, unspecified headache type     Doubt subarachnoid hemorrhage given the prolonged course, not thunderclap. Patient denies any fevers, no neck stiffness, doubt meningitis. No confusion to suggest encephalopathy. Patient denies any  recent head trauma. Nonfocal neurological exam and patient able to ambulate well, doubt posterior stroke. CT head negative for acute abnormality. Patient treated with a migraine cocktail and felt significantly better. Patient discharged home with follow-up with primary care provider. Return for worsening headache, thunderclap presentation, intractable vomiting, fevers with neck stiffness, or other concerning symptoms. Patient discharged in good condition.   Sidney Ace, MD 06/30/15 [id number]  Sidney Ace, MD 06/30/15 1191  Zadie Rhine, MD 07/01/15 [phone]

## 2015-06-30 NOTE — ED Notes (Signed)
Pt reports to the ED for eval of HA x 3 days. Pt reports light and sound make the HA worse. She has hx of migraines and reports this feels similar. Pt denies any N/V. Pt A&Ox4, resp e/u, and skin warm and dry.

## 2015-06-30 NOTE — ED Provider Notes (Signed)
Patient seen/examined in the Emergency Department in conjunction with Resident Physician Provider Ruch Patient reports HA   Exam : awake/alert, no distress, interactive, no arm drift, no facial droop Plan: ct head negative Will need to be ambulated Will also need further discussion with language line as English is a second language for patient    Zadie Rhineonald Taleyah Hillman, MD 06/30/15 1737

## 2015-10-20 ENCOUNTER — Other Ambulatory Visit: Payer: Self-pay | Admitting: Internal Medicine

## 2015-10-20 ENCOUNTER — Ambulatory Visit: Payer: Self-pay | Attending: Internal Medicine | Admitting: Internal Medicine

## 2015-10-20 ENCOUNTER — Encounter: Payer: Self-pay | Admitting: Internal Medicine

## 2015-10-20 ENCOUNTER — Ambulatory Visit: Payer: Self-pay

## 2015-10-20 VITALS — BP 157/91 | HR 80 | Temp 98.1°F | Resp 16 | Wt 124.4 lb

## 2015-10-20 DIAGNOSIS — E2839 Other primary ovarian failure: Secondary | ICD-10-CM

## 2015-10-20 DIAGNOSIS — I10 Essential (primary) hypertension: Secondary | ICD-10-CM | POA: Insufficient documentation

## 2015-10-20 DIAGNOSIS — Z1211 Encounter for screening for malignant neoplasm of colon: Secondary | ICD-10-CM

## 2015-10-20 DIAGNOSIS — Z0000 Encounter for general adult medical examination without abnormal findings: Secondary | ICD-10-CM | POA: Insufficient documentation

## 2015-10-20 DIAGNOSIS — Z23 Encounter for immunization: Secondary | ICD-10-CM

## 2015-10-20 DIAGNOSIS — Z1239 Encounter for other screening for malignant neoplasm of breast: Secondary | ICD-10-CM

## 2015-10-20 DIAGNOSIS — Z1231 Encounter for screening mammogram for malignant neoplasm of breast: Secondary | ICD-10-CM

## 2015-10-20 DIAGNOSIS — Z1159 Encounter for screening for other viral diseases: Secondary | ICD-10-CM

## 2015-10-20 DIAGNOSIS — Z780 Asymptomatic menopausal state: Secondary | ICD-10-CM | POA: Insufficient documentation

## 2015-10-20 LAB — BASIC METABOLIC PANEL WITH GFR
BUN: 12 mg/dL (ref 7–25)
CHLORIDE: 107 mmol/L (ref 98–110)
CO2: 25 mmol/L (ref 20–31)
Calcium: 9.2 mg/dL (ref 8.6–10.4)
Creat: 0.78 mg/dL (ref 0.50–0.99)
GFR, EST NON AFRICAN AMERICAN: 82 mL/min (ref >=60)
Glucose, Bld: 81 mg/dL (ref 65–99)
POTASSIUM: 3.9 mmol/L (ref 3.5–5.3)
Sodium: 142 mmol/L (ref 135–146)

## 2015-10-20 LAB — CBC WITH DIFFERENTIAL/PLATELET
BASOS PCT: 1 %
Basophils Absolute: 57 cells/uL (ref 0–200)
EOS ABS: 57 {cells}/uL (ref 15–500)
Eosinophils Relative: 1 %
HEMATOCRIT: 35.7 % (ref 35.0–45.0)
Hemoglobin: 11.8 g/dL (ref 11.7–15.5)
LYMPHS PCT: 28 %
Lymphs Abs: 1596 cells/uL (ref 850–3900)
MCH: 27.7 pg (ref 27.0–33.0)
MCHC: 33.1 g/dL (ref 32.0–36.0)
MCV: 83.8 fL (ref 80.0–100.0)
MONO ABS: 228 {cells}/uL (ref 200–950)
MPV: 10.5 fL (ref 7.5–12.5)
Monocytes Relative: 4 %
NEUTROS ABS: 3762 {cells}/uL (ref 1500–7800)
Neutrophils Relative %: 66 %
PLATELETS: 306 10*3/uL (ref 140–400)
RBC: 4.26 MIL/uL (ref 3.80–5.10)
RDW: 13.8 % (ref 11.0–15.0)
WBC: 5.7 10*3/uL (ref 3.8–10.8)

## 2015-10-20 MED ORDER — HYDROCHLOROTHIAZIDE 25 MG PO TABS: 25.0000 mg | ORAL_TABLET | Freq: Every day | ORAL | 3 refills | Status: DC

## 2015-10-20 NOTE — Progress Notes (Signed)
Ashley Pena, is a 60 y.o. female  RUE:[id number]:[id number]  JYN:[id number]  DOB - 19-Oct-1955  CC:  Chief Complaint  Patient presents with  . Establish Care       HPI: Ashley Pena is a [age] y.o. female here today to establish medical care, last seen in clinic 9/16. Here w/ her dgt and young grandkids (1 & 283 yo). Does not recall when last MM or papsmear. +postmenopause.  Currently not taking any rx for htn (use to be on hctz).  Does not smoke or drink etoh.  Per pt, seen in June for migraine ha, resolved.  Has trouble sleeping at times, sleeps only few hrs and than wakes up. Denies si/hi/avh/depression screening neg.  Patient has No headache currently, No chest pain, No abdominal pain - No Nausea, No new weakness tingling or numbness, No Cough - SOB.  Per pt, has [employer] card/[employer] discount.  Review of Systems: Per HPI, o/w all systems reviewed and negative.   No Known Allergies Past Medical History:  Diagnosis Date  . Hypertension    Current Outpatient Prescriptions on File Prior to Visit  Medication Sig Dispense Refill  . ibuprofen (ADVIL,MOTRIN) 200 MG tablet Take 200 mg by mouth every 6 (six) hours as needed for moderate pain.     No current facility-administered medications on file prior to visit.    No family history on file. Social History   Social History  . Marital status: Single    Spouse name: N/A  . Number of children: N/A  . Years of education: N/A   Occupational History  . Not on file.   Social History Main Topics  . Smoking status: Never Smoker  . Smokeless tobacco: Not on file  . Alcohol use No  . Drug use: No  . Sexual activity: Not on file   Other Topics Concern  . Not on file   Social History Narrative  . No narrative on file    Objective:   Vitals:   10/20/15 1115  BP: (!) 157/91  Pulse: 80  Resp: 16  Temp: 98.1 F (36.7 C)    Filed Weights   10/20/15 1115  Weight: 124 lb 6.4 oz (56.4 kg)    BP Readings from Last 3 Encounters:    10/20/15 (!) 157/91  06/30/15 144/77  10/09/14 (!) 168/93    Physical Exam: Constitutional: Patient appears well-developed and well-nourished. No distress. AAOx3, pleasant. HENT: Normocephalic, atraumatic, External right and left ear normal. Oropharynx is clear and moist.  bilat TMs clear. Eyes: Conjunctivae and EOM are normal. PERRL, no scleral icterus. Neck: Normal ROM. Neck supple. No JVD. CVS: RRR, S1/S2 +, no murmurs, no gallops, no carotid bruit.  Pulmonary: Effort and breath sounds normal, no stridor, rhonchi, wheezes, rales.  Abdominal: Soft. BS +, no distension, tenderness, rebound or guarding.  Musculoskeletal: Normal range of motion. No edema and no tenderness.  LE: bilat/ no c/c/e, pulses 2+ bilateral. Neuro: Alert.  muscle tone coordination wnl. No cranial nerve deficit grossly. Skin: Skin is warm and dry. No rash noted. Not diaphoretic. No erythema. No pallor. Psychiatric: Normal mood and affect. Behavior, judgment, thought content normal.  Lab Results  Component Value Date   WBC 4.8 02/20/2013   HGB 12.5 02/20/2013   HCT 36.9 02/20/2013   MCV 83.7 02/20/2013   PLT 312 02/20/2013   Lab Results  Component Value Date   CREATININE 0.79 02/20/2013   BUN 11 02/20/2013   NA 141 02/20/2013   K 3.9 02/20/2013  CL 106 02/20/2013   CO2 28 02/20/2013    No results found for: HGBA1C Lipid Panel     Component Value Date/Time   CHOL 211 (H) 01/24/2007 0102   TRIG 122 01/24/2007 0102   HDL 55 01/24/2007 0102   CHOLHDL 3.8 Ratio 01/24/2007 0102   VLDL 24 01/24/2007 0102   LDLCALC 132 (H) 01/24/2007 0102       Depression screen [facility] 2/9 10/20/2015 06/01/2013 02/20/2013  Decreased Interest 1 0 0  Down, Depressed, Hopeless 1 0 0  PHQ - 2 Score 2 0 0  Altered sleeping 2 - -  Tired, decreased energy 0 - -  Change in appetite 0 - -  Feeling bad or failure about yourself  0 - -  Trouble concentrating 0 - -  Moving slowly or fidgety/restless 0 - -  Suicidal thoughts 0  - -  PHQ-9 Score 4 - -    Assessment and plan:   1. HTN (hypertension), benign Needs to take hctz 25 qday, renewed Dash diet discussed, consequences of untrx htn discussed as well.  2. Health care maintenance - CBC with Differential - BASIC METABOLIC PANEL WITH GFR - due for papsmear, asked her to make appt w. Me in few weeks. - needs TDAP (we are currently out of it, next time)  3. Estrogen deficiency - VITAMIN D 25 Hydroxy (Vit-D Deficiency, Fractures) - DG Bone Density; Future  4. Encounter for hepatitis C screening test for low risk patient - Hepatitis C antibody  5. Breast cancer screening - MM Digital Screening; Future  6. Colon cancer screening - Ambulatory referral to Gastroenterology  7. Encounter for immunization - Flu Vaccine QUAD 36+ mos IM   Return in about 3 weeks (around 11/10/2015) for pap /htn.  The patient was given clear instructions to go to ER or return to medical center if symptoms don't improve, worsen or new problems develop. The patient verbalized understanding. The patient was told to call to get lab results if they haven't heard anything in the next week.    This note has been created with Education officer, environmental. Any transcriptional errors are unintentional.   Pete Glatter, MD, MBA/MHA [facility] [place], [place] [phone]   10/20/2015, 12:32 PM

## 2015-10-20 NOTE — Patient Instructions (Addendum)
DASH Eating Plan DASH stands for "Dietary Approaches to Stop Hypertension." The DASH eating plan is a healthy eating plan that has been shown to reduce high blood pressure (hypertension). Additional health benefits may include reducing the risk of type 2 diabetes mellitus, heart disease, and stroke. The DASH eating plan may also help with weight loss. WHAT DO I NEED TO KNOW ABOUT THE DASH EATING PLAN? For the DASH eating plan, you will follow these general guidelines:  Choose foods with a percent daily value for sodium of less than 5% (as listed on the food label).  Use salt-free seasonings or herbs instead of table salt or sea salt.  Check with your health care provider or pharmacist before using salt substitutes.  Eat lower-sodium products, often labeled as "lower sodium" or "no salt added."  Eat fresh foods.  Eat more vegetables, fruits, and low-fat dairy products.  Choose whole grains. Look for the word "whole" as the first word in the ingredient list.  Choose fish and skinless chicken or [place] more often than red meat. Limit fish, poultry, and meat to 6 oz (170 g) each day.  Limit sweets, desserts, sugars, and sugary drinks.  Choose heart-healthy fats.  Limit cheese to 1 oz (28 g) per day.  Eat more home-cooked food and less restaurant, buffet, and fast food.  Limit fried foods.  Cook foods using methods other than frying.  Limit canned vegetables. If you do use them, rinse them well to decrease the sodium.  When eating at a restaurant, ask that your food be prepared with less salt, or no salt if possible. WHAT FOODS CAN I EAT? Seek help from a dietitian for individual calorie needs. Grains Whole grain or whole wheat bread. Brown rice. Whole grain or whole wheat pasta. Quinoa, bulgur, and whole grain cereals. Low-sodium cereals. Corn or whole wheat flour tortillas. Whole grain cornbread. Whole grain crackers. Low-sodium crackers. Vegetables Fresh or frozen vegetables  (raw, steamed, roasted, or grilled). Low-sodium or reduced-sodium tomato and vegetable juices. Low-sodium or reduced-sodium tomato sauce and paste. Low-sodium or reduced-sodium canned vegetables.  Fruits All fresh, canned (in natural juice), or frozen fruits. Meat and Other Protein Products Ground beef (85% or leaner), grass-fed beef, or beef trimmed of fat. Skinless chicken or [place]. Ground chicken or [place]. Pork trimmed of fat. All fish and seafood. Eggs. Dried beans, peas, or lentils. Unsalted nuts and seeds. Unsalted canned beans. Dairy Low-fat dairy products, such as skim or 1% milk, 2% or reduced-fat cheeses, low-fat ricotta or cottage cheese, or plain low-fat yogurt. Low-sodium or reduced-sodium cheeses. Fats and Oils Tub margarines without trans fats. Light or reduced-fat mayonnaise and salad dressings (reduced sodium). Avocado. Safflower, olive, or canola oils. Natural peanut or almond butter. Other Unsalted popcorn and pretzels. The items listed above may not be a complete list of recommended foods or beverages. Contact your dietitian for more options. WHAT FOODS ARE NOT RECOMMENDED? Grains White bread. White pasta. White rice. Refined cornbread. Bagels and croissants. Crackers that contain trans fat. Vegetables Creamed or fried vegetables. Vegetables in a cheese sauce. Regular canned vegetables. Regular canned tomato sauce and paste. Regular tomato and vegetable juices. Fruits Dried fruits. Canned fruit in light or heavy syrup. Fruit juice. Meat and Other Protein Products Fatty cuts of meat. Ribs, chicken wings, bacon, sausage, bologna, salami, chitterlings, fatback, hot dogs, bratwurst, and packaged luncheon meats. Salted nuts and seeds. Canned beans with salt. Dairy Whole or 2% milk, cream, half-and-half, and cream cheese. Whole-fat or sweetened yogurt. Full-fat  cheeses or blue cheese. Nondairy creamers and whipped toppings. Processed cheese, cheese spreads, or cheese  curds. Condiments Onion and garlic salt, seasoned salt, table salt, and sea salt. Canned and packaged gravies. Worcestershire sauce. Tartar sauce. Barbecue sauce. Teriyaki sauce. Soy sauce, including reduced sodium. Steak sauce. Fish sauce. Oyster sauce. Cocktail sauce. Horseradish. Ketchup and mustard. Meat flavorings and tenderizers. Bouillon cubes. Hot sauce. Tabasco sauce. Marinades. Taco seasonings. Relishes. Fats and Oils Butter, stick margarine, lard, shortening, ghee, and bacon fat. Coconut, palm kernel, or palm oils. Regular salad dressings. Other Pickles and olives. Salted popcorn and pretzels. The items listed above may not be a complete list of foods and beverages to avoid. Contact your dietitian for more information. WHERE CAN I FIND MORE INFORMATION? National Heart, Lung, and [employer] Institute: travelstabloid.com   This information is not intended to replace advice given to you by your health care provider. Make sure you discuss any questions you have with your health care provider.   Document Released: 12/24/2010 Document Revised: 01/25/2014 Document Reviewed: 11/08/2012 Elsevier Interactive Patient Education 2016 [employer]. Hypertension Hypertension is another name for high blood pressure. High blood pressure forces your heart to work harder to pump blood. A blood pressure reading has two numbers, which includes a higher number over a lower number (example: 110/72). HOME CARE   Have your blood pressure rechecked by your doctor.  Only take medicine as told by your doctor. Follow the directions carefully. The medicine does not work as well if you skip doses. Skipping doses also puts you at risk for problems.  Do not smoke.  Monitor your blood pressure at home as told by your doctor. GET HELP IF:  You think you are having a reaction to the medicine you are taking.  You have repeat headaches or feel dizzy.  You have puffiness (swelling)  in your ankles.  You have trouble with your vision. GET HELP RIGHT AWAY IF:   You get a very bad headache and are confused.  You feel weak, numb, or faint.  You get chest or belly (abdominal) pain.  You throw up (vomit).  You cannot breathe very well. MAKE SURE YOU:   Understand these instructions.  Will watch your condition.  Will get help right away if you are not doing well or get worse.   This information is not intended to replace advice given to you by your health care provider. Make sure you discuss any questions you have with your health care provider.   Document Released: 06/23/2007 Document Revised: 01/09/2013 Document Reviewed: 10/27/2012 Elsevier Interactive Patient Education 2016 Else[employer] Inc[employer]  - Insomnia Insomnia is a sleep disorder that makes it difficult to fall asleep or to stay asleep. Insomnia can cause tiredness (fatigue), low energy, difficulty concentrating, mood swings, and poor performance at work or school.  There are three different ways to classify insomnia:  Difficulty falling asleep.  Difficulty staying asleep.  Waking up too early in the morning. Any type of insomnia can be long-term (chronic) or short-term (acute). Both are common. Short-term insomnia usually lasts for three months or less. Chronic insomnia occurs at least three times a week for longer than three months. CAUSES  Insomnia may be caused by another condition, situation, or substance, such as:  Anxiety.  Certain medicines.  Gastroesophageal reflux disease (GERD) or other gastrointestinal conditions.  Asthma or other breathing conditions.  Restless legs syndrome, sleep apnea, or other sleep disorders.  Chronic pain.  Menopause. This may include hot flashes.  Stroke.  Abuse of alcohol, tobacco, or illegal drugs.  Depression.  Caffeine.   Neurological disorders, such as Alzheimer disease.  An overactive thyroid (hyperthyroidism). The cause of insomnia may  not be known. RISK FACTORS Risk factors for insomnia include:  Gender. Women are more commonly affected than men.  Age. Insomnia is more common as you get older.  Stress. This may involve your professional or personal life.  Income. Insomnia is more common in people with lower income.  Lack of exercise.   Irregular work schedule or night shifts.  Traveling between different time zones. SIGNS AND SYMPTOMS If you have insomnia, trouble falling asleep or trouble staying asleep is the main symptom. This may lead to other symptoms, such as:  Feeling fatigued.  Feeling nervous about going to sleep.  Not feeling rested in the morning.  Having trouble concentrating.  Feeling irritable, anxious, or depressed. TREATMENT  Treatment for insomnia depends on the cause. If your insomnia is caused by an underlying condition, treatment will focus on addressing the condition. Treatment may also include:   Medicines to help you sleep.  Counseling or therapy.  Lifestyle adjustments. HOME CARE INSTRUCTIONS   Take medicines only as directed by your health care provider.  Keep regular sleeping and waking hours. Avoid naps.  Keep a sleep diary to help you and your health care provider figure out what could be causing your insomnia. Include:   When you sleep.  When you wake up during the night.  How well you sleep.   How rested you feel the next day.  Any side effects of medicines you are taking.  What you eat and drink.   Make your bedroom a comfortable place where it is easy to fall asleep:  Put up shades or special blackout curtains to block light from outside.  Use a white noise machine to block noise.  Keep the temperature cool.   Exercise regularly as directed by your health care provider. Avoid exercising right before bedtime.  Use relaxation techniques to manage stress. Ask your health care provider to suggest some techniques that may work well for you. These  may include:  Breathing exercises.  Routines to release muscle tension.  Visualizing peaceful scenes.  Cut back on alcohol, caffeinated beverages, and cigarettes, especially close to bedtime. These can disrupt your sleep.  Do not overeat or eat spicy foods right before bedtime. This can lead to digestive discomfort that can make it hard for you to sleep.  Limit screen use before bedtime. This includes:  Watching TV.  Using your smartphone, tablet, and computer.  Stick to a routine. This can help you fall asleep faster. Try to do a quiet activity, brush your teeth, and go to bed at the same time each night.  Get out of bed if you are still awake after 15 minutes of trying to sleep. Keep the lights down, but try reading or doing a quiet activity. When you feel sleepy, go back to bed.  Make sure that you drive carefully. Avoid driving if you feel very sleepy.  Keep all follow-up appointments as directed by your health care provider. This is important. SEEK MEDICAL CARE IF:   You are tired throughout the day or have trouble in your daily routine due to sleepiness.  You continue to have sleep problems or your sleep problems get worse. SEEK IMMEDIATE MEDICAL CARE IF:   You have serious thoughts about hurting yourself or someone else.   This information is not intended to replace advice  given to you by your health care provider. Make sure you discuss any questions you have with your health care provider.   Document Released: 01/02/2000 Document Revised: 09/25/2014 Document Reviewed: 10/05/2013 Elsevier Interactive Patient Education 2016 [place] is a normal process in which your reproductive ability comes to an end. This process happens gradually over a span of months to years, usually between the ages of 56 and 58. Menopause is complete when you have missed 12 consecutive menstrual periods. It is important to talk with your health care provider about some of  the most common conditions that affect postmenopausal women, such as heart disease, cancer, and bone loss (osteoporosis). Adopting a healthy lifestyle and getting preventive care can help to promote your health and wellness. Those actions can also lower your chances of developing some of these common conditions. WHAT SHOULD I KNOW ABOUT MENOPAUSE? During menopause, you may experience a number of symptoms, such as:  Moderate-to-severe hot flashes.  Night sweats.  Decrease in sex drive.  Mood swings.  Headaches.  Tiredness.  Irritability.  Memory problems.  Insomnia. Choosing to treat or not to treat menopausal changes is an individual decision that you make with your health care provider. WHAT SHOULD I KNOW ABOUT HORMONE REPLACEMENT THERAPY AND SUPPLEMENTS? Hormone therapy products are effective for treating symptoms that are associated with menopause, such as hot flashes and night sweats. Hormone replacement carries certain risks, especially as you become older. If you are thinking about using estrogen or estrogen with progestin treatments, discuss the benefits and risks with your health care provider. WHAT SHOULD I KNOW ABOUT HEART DISEASE AND STROKE? Heart disease, heart attack, and stroke become more likely as you age. This may be due, in part, to the hormonal changes that your body experiences during menopause. These can affect how your body processes dietary fats, triglycerides, and cholesterol. Heart attack and stroke are both medical emergencies. There are many things that you can do to help prevent heart disease and stroke:  Have your blood pressure checked at least every 1-2 years. High blood pressure causes heart disease and increases the risk of stroke.  If you are 65-68 years old, ask your health care provider if you should take aspirin to prevent a heart attack or a stroke.  Do not use any tobacco products, including cigarettes, chewing tobacco, or electronic cigarettes.  If you need help quitting, ask your health care provider.  It is important to eat a healthy diet and maintain a healthy weight.  Be sure to include plenty of vegetables, fruits, low-fat dairy products, and lean protein.  Avoid eating foods that are high in solid fats, added sugars, or salt (sodium).  Get regular exercise. This is one of the most important things that you can do for your health.  Try to exercise for at least 150 minutes each week. The type of exercise that you do should increase your heart rate and make you sweat. This is known as moderate-intensity exercise.  Try to do strengthening exercises at least twice each week. Do these in addition to the moderate-intensity exercise.  Know your numbers.Ask your health care provider to check your cholesterol and your blood glucose. Continue to have your blood tested as directed by your health care provider. WHAT SHOULD I KNOW ABOUT CANCER SCREENING? There are several types of cancer. Take the following steps to reduce your risk and to catch any cancer development as early as possible. Breast Cancer  Practice breast self-awareness.  This means understanding how your breasts normally appear and feel.  It also means doing regular breast self-exams. Let your health care provider know about any changes, no matter how small.  If you are 51 or older, have a clinician do a breast exam (clinical breast exam or CBE) every year. Depending on your age, family history, and medical history, it may be recommended that you also have a yearly breast X-ray (mammogram).  If you have a family history of breast cancer, talk with your health care provider about genetic screening.  If you are at high risk for breast cancer, talk with your health care provider about having an MRI and a mammogram every year.  Breast cancer (BRCA) gene test is recommended for women who have family members with BRCA-related cancers. Results of the assessment will  determine the need for genetic counseling and BRCA1 and for BRCA2 testing. BRCA-related cancers include these types:  Breast. This occurs in males or females.  Ovarian.  Tubal. This may also be called fallopian tube cancer.  Cancer of the abdominal or pelvic lining (peritoneal cancer).  Prostate.  Pancreatic. Cervical, Uterine, and Ovarian Cancer Your health care provider may recommend that you be screened regularly for cancer of the pelvic organs. These include your ovaries, uterus, and vagina. This screening involves a pelvic exam, which includes checking for microscopic changes to the surface of your cervix (Pap test).  For women ages 21-65, health care providers may recommend a pelvic exam and a Pap test every three years. For women ages 14-65, they may recommend the Pap test and pelvic exam, combined with testing for human papilloma virus (HPV), every five years. Some types of HPV increase your risk of cervical cancer. Testing for HPV may also be done on women of any age who have unclear Pap test results.  Other health care providers may not recommend any screening for nonpregnant women who are considered low risk for pelvic cancer and have no symptoms. Ask your health care provider if a screening pelvic exam is right for you.  If you have had past treatment for cervical cancer or a condition that could lead to cancer, you need Pap tests and screening for cancer for at least 20 years after your treatment. If Pap tests have been discontinued for you, your risk factors (such as having a new sexual partner) need to be reassessed to determine if you should start having screenings again. Some women have medical problems that increase the chance of getting cervical cancer. In these cases, your health care provider may recommend that you have screening and Pap tests more often.  If you have a family history of uterine cancer or ovarian cancer, talk with your health care provider about genetic  screening.  If you have vaginal bleeding after reaching menopause, tell your health care provider.  There are currently no reliable tests available to screen for ovarian cancer. Lung Cancer Lung cancer screening is recommended for adults 69-82 years old who are at high risk for lung cancer because of a history of smoking. A yearly low-dose CT scan of the lungs is recommended if you:  Currently smoke.  Have a history of at least 30 pack-years of smoking and you currently smoke or have quit within the past 15 years. A pack-year is smoking an average of one pack of cigarettes per day for one year. Yearly screening should:  Continue until it has been 15 years since you quit.  Stop if you develop a health problem  that would prevent you from having lung cancer treatment. Colorectal Cancer  This type of cancer can be detected and can often be prevented.  Routine colorectal cancer screening usually begins at age 45 and continues through age 25.  If you have risk factors for colon cancer, your health care provider may recommend that you be screened at an earlier age.  If you have a family history of colorectal cancer, talk with your health care provider about genetic screening.  Your health care provider may also recommend using home test kits to check for hidden blood in your stool.  A small camera at the end of a tube can be used to examine your colon directly (sigmoidoscopy or colonoscopy). This is done to check for the earliest forms of colorectal cancer.  Direct examination of the colon should be repeated every 5-10 years until age 55. However, if early forms of precancerous polyps or small growths are found or if you have a family history or genetic risk for colorectal cancer, you may need to be screened more often. Skin Cancer  Check your skin from head to toe regularly.  Monitor any moles. Be sure to tell your health care provider:  About any new moles or changes in moles,  especially if there is a change in a mole's shape or color.  If you have a mole that is larger than the size of a pencil eraser.  If any of your family members has a history of skin cancer, especially at a young age, talk with your health care provider about genetic screening.  Always use sunscreen. Apply sunscreen liberally and repeatedly throughout the day.  Whenever you are outside, protect yourself by wearing long sleeves, pants, a wide-brimmed hat, and sunglasses. WHAT SHOULD I KNOW ABOUT OSTEOPOROSIS? Osteoporosis is a condition in which bone destruction happens more quickly than new bone creation. After menopause, you may be at an increased risk for osteoporosis. To help prevent osteoporosis or the bone fractures that can happen because of osteoporosis, the following is recommended:  If you are 42-19 years old, get at least 1,000 mg of calcium and at least 600 mg of vitamin D per day.  If you are older than age 70 but younger than age 15, get at least 1,200 mg of calcium and at least 600 mg of vitamin D per day.  If you are older than age 71, get at least 1,200 mg of calcium and at least 800 mg of vitamin D per day. Smoking and excessive alcohol intake increase the risk of osteoporosis. Eat foods that are rich in calcium and vitamin D, and do weight-bearing exercises several times each week as directed by your health care provider. WHAT SHOULD I KNOW ABOUT HOW MENOPAUSE AFFECTS MY MENTAL HEALTH? Depression may occur at any age, but it is more common as you become older. Common symptoms of depression include:  Low or sad mood.  Changes in sleep patterns.  Changes in appetite or eating patterns.  Feeling an overall lack of motivation or enjoyment of activities that you previously enjoyed.  Frequent crying spells. Talk with your health care provider if you think that you are experiencing depression. WHAT SHOULD I KNOW ABOUT IMMUNIZATIONS? It is important that you get and maintain  your immunizations. These include:  Tetanus, diphtheria, and pertussis (Tdap) booster vaccine.  Influenza every year before the flu season begins.  Pneumonia vaccine.  Shingles vaccine. Your health care provider may also recommend other immunizations.   This information is  not intended to replace advice given to you by your health care provider. Make sure you discuss any questions you have with your health care provider.   Document Released: 02/26/2005 Document Revised: 01/25/2014 Document Reviewed: 09/06/2013 Elsevier Interactive Patient Education 2016 [employer] Inc[employer]  Influenza Virus Vaccine injection (Fluarix) What is this medicine? INFLUENZA VIRUS VACCINE (in floo EN zuh VAHY ruhs vak SEEN) helps to reduce the risk of getting influenza also known as the flu. This medicine may be used for other purposes; ask your health care provider or pharmacist if you have questions. What should I tell my health care provider before I take this medicine? They need to know if you have any of these conditions: -bleeding disorder like hemophilia -fever or infection -Guillain-Barre syndrome or other neurological problems -immune system problems -infection with the human immunodeficiency virus (HIV) or AIDS -low blood platelet counts -multiple sclerosis -an unusual or allergic reaction to influenza virus vaccine, eggs, chicken proteins, latex, gentamicin, other medicines, foods, dyes or preservatives -pregnant or trying to get pregnant -breast-feeding How should I use this medicine? This vaccine is for injection into a muscle. It is given by a health care professional. A copy of Vaccine Information Statements will be given before each vaccination. Read this sheet carefully each time. The sheet may change frequently. Talk to your pediatrician regarding the use of this medicine in children. Special care may be needed. Overdosage: If you think you have taken too much of this medicine contact a poison  control center or emergency room at once. NOTE: This medicine is only for you. Do not share this medicine with others. What if I miss a dose? This does not apply. What may interact with this medicine? -chemotherapy or radiation therapy -medicines that lower your immune system like etanercept, anakinra, infliximab, and adalimumab -medicines that treat or prevent blood clots like warfarin -phenytoin -steroid medicines like prednisone or cortisone -theophylline -vaccines This list may not describe all possible interactions. Give your health care provider a list of all the medicines, herbs, non-prescription drugs, or dietary supplements you use. Also tell them if you smoke, drink alcohol, or use illegal drugs. Some items may interact with your medicine. What should I watch for while using this medicine? Report any side effects that do not go away within 3 days to your doctor or health care professional. Call your health care provider if any unusual symptoms occur within 6 weeks of receiving this vaccine. You may still catch the flu, but the illness is not usually as bad. You cannot get the flu from the vaccine. The vaccine will not protect against colds or other illnesses that may cause fever. The vaccine is needed every year. What side effects may I notice from receiving this medicine? Side effects that you should report to your doctor or health care professional as soon as possible: -allergic reactions like skin rash, itching or hives, swelling of the face, lips, or tongue Side effects that usually do not require medical attention (report to your doctor or health care professional if they continue or are bothersome): -fever -headache -muscle aches and pains -pain, tenderness, redness, or swelling at site where injected -weak or tired This list may not describe all possible side effects. Call your doctor for medical advice about side effects. You may report side effects to FDA at  1-800-FDA-1088. Where should I keep my medicine? This vaccine is only given in a clinic, pharmacy, doctor's office, or other health care setting and will not be stored at  home. NOTE: This sheet is a summary. It may not cover all possible information. If you have questions about this medicine, talk to your doctor, pharmacist, or health care provider.    2016, Elsevier/Gold Standard. (2007-08-02 09:30:40)

## 2015-10-20 NOTE — Progress Notes (Signed)
Pt is in the office today for establish care Pt states she has pain in her head sometimes Pt states she is not in any pain

## 2015-10-21 LAB — HEPATITIS C ANTIBODY: HCV AB: NEGATIVE

## 2015-10-21 LAB — VITAMIN D 25 HYDROXY (VIT D DEFICIENCY, FRACTURES): Vit D, 25-Hydroxy: 31 ng/mL (ref 30–100)

## 2015-10-24 ENCOUNTER — Telehealth: Payer: Self-pay

## 2015-10-24 MED FILL — HYDROCHLOROTHIAZIDE 25 MG T: 25 | 30 days supply | Qty: 30 | Fill #0

## 2015-10-24 NOTE — Telephone Encounter (Signed)
Contacted pt to go over lab results. Pt didn't answer lvm asking pt to give me a call at her earliest convenience  

## 2015-10-27 ENCOUNTER — Telehealth: Payer: Self-pay

## 2015-10-27 NOTE — Telephone Encounter (Signed)
Will be mailing lab results out today

## 2015-11-20 ENCOUNTER — Encounter: Payer: Self-pay | Admitting: Internal Medicine

## 2015-12-10 MED FILL — HYDROCHLOROTHIAZIDE 25 MG T: 25 | 30 days supply | Qty: 30 | Fill #1

## 2016-01-21 ENCOUNTER — Encounter: Payer: Self-pay | Admitting: Internal Medicine

## 2016-01-21 ENCOUNTER — Ambulatory Visit: Payer: Self-pay | Attending: Internal Medicine | Admitting: Internal Medicine

## 2016-01-21 VITALS — BP 136/90 | HR 92 | Temp 98.4°F | Resp 16 | Wt 125.2 lb

## 2016-01-21 DIAGNOSIS — M25512 Pain in left shoulder: Secondary | ICD-10-CM | POA: Insufficient documentation

## 2016-01-21 DIAGNOSIS — I10 Essential (primary) hypertension: Secondary | ICD-10-CM | POA: Insufficient documentation

## 2016-01-21 DIAGNOSIS — Z124 Encounter for screening for malignant neoplasm of cervix: Secondary | ICD-10-CM

## 2016-01-21 DIAGNOSIS — Z01419 Encounter for gynecological examination (general) (routine) without abnormal findings: Secondary | ICD-10-CM | POA: Insufficient documentation

## 2016-01-21 MED ORDER — HYDROCHLOROTHIAZIDE 25 MG PO TABS: 25.0000 mg | ORAL_TABLET | Freq: Every day | ORAL | 3 refills | Status: DC

## 2016-01-21 MED FILL — HYDROCHLOROTHIAZIDE 25 MG T: 25 | 30 days supply | Qty: 30 | Fill #2

## 2016-01-21 NOTE — Progress Notes (Signed)
Ashley Pena, is a 61 y.o. female  [id number]  IHK:[id number]  DOB - [birth year]  Chief Complaint  Patient presents with  . Gynecologic Exam  . Hypertension        Subjective:   Ashley Pena is a 61 y.o. female here today for a follow up visit, last seen 10/20/15, here for pap and htn.  Pt taking her bp meds, and doing much better overall w/ bp.   No c/o today.  Last pap smear and menses was years ago.  Not currently sexually active.  Denies any abnml discharge. Has not heard about MM yet - was pending scholarship.  Of note, c/o of left shoulder ache/pain last 2-3 days, has not tried any meds.  Patient has No headache, No chest pain, No abdominal pain - No Nausea, No new weakness tingling or numbness, No Cough - SOB.  No problems updated.  ALLERGIES: No Known Allergies  PAST MEDICAL HISTORY: Past Medical History:  Diagnosis Date  . Hypertension     MEDICATIONS AT HOME: Prior to Admission medications   Medication Sig Start Date End Date Taking? Authorizing Provider  hydrochlorothiazide (HYDRODIURIL) 25 MG tablet Take 1 tablet (25 mg total) by mouth daily. 10/20/15   Pete Glatter, MD  ibuprofen (ADVIL,MOTRIN) 200 MG tablet Take 200 mg by mouth every 6 (six) hours as needed for moderate pain.    Historical Provider, MD     Objective:   Vitals:   01/21/16 1025  BP: 136/90  Pulse: 92  Resp: 16  Temp: 98.4 F (36.9 C)  TempSrc: Oral  SpO2: 97%  Weight: 125 lb 3.2 oz (56.8 kg)    Exam General appearance : Awake, alert, not in any distress. Speech Clear. Not toxic looking, pleasant. HEENT: Atraumatic and Normocephalic, pupils equally reactive to light. Neck: supple, no JVD. No cervical lymphadenopathy.  Breast /axilla: bilat nml appearance, not dippling noted. No palpable masses/nodules/nipple discharge noted on exam MSK - mild ttp to left shoulder on external and internal rotation, no particular area ttp, diffuse. Chest:Good air entry bilaterally, no  added sounds. CVS: S1 S2 regular, no murmurs/gallups or rubs. Abdomen: Bowel sounds active, Non tender and not distended with no gaurding, rigidity or rebound. Pelvic Exam: Cervix normal in appearance, external genitalia normal, no adnexal masses or tenderness, no cervical motion tenderness, rectovaginal septum normal, uterus normal size, shape, and consistency and vagina normal with white thick discharge   Extremities: B/L Lower Ext shows no edema, both legs are warm to touch Neurology: Awake alert, and oriented X 3, CN II-XII grossly intact, Non focal Skin:No Rash  Data Review No results found for: HGBA1C  Depression screen [facility] 2/9 01/21/2016 10/20/2015 06/01/2013 02/20/2013  Decreased Interest 0 1 0 0  Down, Depressed, Hopeless 0 1 0 0  PHQ - 2 Score 0 2 0 0  Altered sleeping - 2 - -  Tired, decreased energy - 0 - -  Change in appetite - 0 - -  Feeling bad or failure about yourself  - 0 - -  Trouble concentrating - 0 - -  Moving slowly or fidgety/restless - 0 - -  Suicidal thoughts - 0 - -  PHQ-9 Score - 4 - -      Assessment & Plan   1. Acute pain of left shoulder Recd ice/motrin prn w/ food. If not improved in next few weeks, to come back and be evaulated, suspect arthritic pain.  Denied trauma.  2. Pap smear for cervical cancer screening Labs  ordered.  3. Htn, better on hctz 25 qd, encouraged to pick up refill and take.     Patient have been counseled extensively about nutrition and exercise  Return in about 3 months (around 04/20/2016), or if symptoms worsen or fail to improve.  The patient was given clear instructions to go to ER or return to medical center if symptoms don't improve, worsen or new problems develop. The patient verbalized understanding. The patient was told to call to get lab results if they haven't heard anything in the next week.   This note has been created with Education officer, environmentalDragon speech recognition software and smart phrase technology. Any transcriptional errors  are unintentional.   Pete Glatterawn T Tani Virgo, MD, MBA/MHA [facility] [place], [place] [phone]   01/21/2016, 11:09 AM

## 2016-01-21 NOTE — Patient Instructions (Addendum)
Try motrin 489m x 3x a day, take w/ food, for shoulder pain. Warm heat 3x a day as able.   Musculoskeletal Pain Musculoskeletal pain is muscle and bone aches and pains. This pain can occur in any part of the body. Follow these instructions at home:  Only take medicines for pain, discomfort, or fever as told by your health care provider.  You may continue all activities unless the activities cause more pain. When the pain lessens, slowly resume normal activities. Gradually increase the intensity and duration of the activities or exercise.  During periods of severe pain, bed rest may be helpful. Lie or sit in any position that is comfortable, but get out of bed and walk around at least every several hours.  If directed, put ice on the injured area.  Put ice in a plastic bag.  Place a towel between your skin and the bag.  Leave the ice on for 20 minutes, 2-3 times a day. Contact a health care provider if:  Your pain is getting worse.  Your pain is not relieved with medicines.  You lose function in the area of the pain if the pain is in your arms, legs, or neck. This information is not intended to replace advice given to you by your health care provider. Make sure you discuss any questions you have with your health care provider. Document Released: 01/04/2005 Document Revised: 06/17/2015 Document Reviewed: 09/08/2012 Elsevier Interactive Patient Education  2017 [place] for Postmenopausal Women Introduction Menopause is a normal process in which your reproductive ability comes to an end. This process happens gradually over a span of months to years, usually between the ages of 455and 564 Menopause is complete when you have missed 12 consecutive menstrual periods. It is important to talk with your health care provider about some of the most common conditions that affect postmenopausal women, such as heart disease, cancer, and bone loss (osteoporosis).  Adopting a healthy lifestyle and getting preventive care can help to promote your health and wellness. Those actions can also lower your chances of developing some of these common conditions. What should I know about menopause? During menopause, you may experience a number of symptoms, such as:  Moderate-to-severe hot flashes.  Night sweats.  Decrease in sex drive.  Mood swings.  Headaches.  Tiredness.  Irritability.  Memory problems.  Insomnia. Choosing to treat or not to treat menopausal changes is an individual decision that you make with your health care provider. What should I know about hormone replacement therapy and supplements? Hormone therapy products are effective for treating symptoms that are associated with menopause, such as hot flashes and night sweats. Hormone replacement carries certain risks, especially as you become older. If you are thinking about using estrogen or estrogen with progestin treatments, discuss the benefits and risks with your health care provider. What should I know about heart disease and stroke? Heart disease, heart attack, and stroke become more likely as you age. This may be due, in part, to the hormonal changes that your body experiences during menopause. These can affect how your body processes dietary fats, triglycerides, and cholesterol. Heart attack and stroke are both medical emergencies. There are many things that you can do to help prevent heart disease and stroke:  Have your blood pressure checked at least every 1-2 years. High blood pressure causes heart disease and increases the risk of stroke.  If you are [id number] old, ask your health care provider  if you should take aspirin to prevent a heart attack or a stroke.  Do not use any tobacco products, including cigarettes, chewing tobacco, or electronic cigarettes. If you need help quitting, ask your health care provider.  It is important to eat a healthy diet and maintain a healthy  weight.  Be sure to include plenty of vegetables, fruits, low-fat dairy products, and lean protein.  Avoid eating foods that are high in solid fats, added sugars, or salt (sodium).  Get regular exercise. This is one of the most important things that you can do for your health.  Try to exercise for at least 150 minutes each week. The type of exercise that you do should increase your heart rate and make you sweat. This is known as moderate-intensity exercise.  Try to do strengthening exercises at least twice each week. Do these in addition to the moderate-intensity exercise.  Know your numbers.Ask your health care provider to check your cholesterol and your blood glucose. Continue to have your blood tested as directed by your health care provider. What should I know about cancer screening? There are several types of cancer. Take the following steps to reduce your risk and to catch any cancer development as early as possible. Breast Cancer  Practice breast self-awareness.  This means understanding how your breasts normally appear and feel.  It also means doing regular breast self-exams. Let your health care provider know about any changes, no matter how small.  If you are 81 or older, have a clinician do a breast exam (clinical breast exam or CBE) every year. Depending on your age, family history, and medical history, it may be recommended that you also have a yearly breast X-ray (mammogram).  If you have a family history of breast cancer, talk with your health care provider about genetic screening.  If you are at high risk for breast cancer, talk with your health care provider about having an MRI and a mammogram every year.  Breast cancer (BRCA) gene test is recommended for women who have family members with BRCA-related cancers. Results of the assessment will determine the need for genetic counseling and BRCA1 and for BRCA2 testing. BRCA-related cancers include these types:  Breast.  This occurs in males or females.  Ovarian.  Tubal. This may also be called fallopian tube cancer.  Cancer of the abdominal or pelvic lining (peritoneal cancer).  Prostate.  Pancreatic. Cervical, Uterine, and Ovarian Cancer  Your health care provider may recommend that you be screened regularly for cancer of the pelvic organs. These include your ovaries, uterus, and vagina. This screening involves a pelvic exam, which includes checking for microscopic changes to the surface of your cervix (Pap test).  For women ages 21-65, health care providers may recommend a pelvic exam and a Pap test every three years. For women ages 40-65, they may recommend the Pap test and pelvic exam, combined with testing for human papilloma virus (HPV), every five years. Some types of HPV increase your risk of cervical cancer. Testing for HPV may also be done on women of any age who have unclear Pap test results.  Other health care providers may not recommend any screening for nonpregnant women who are considered low risk for pelvic cancer and have no symptoms. Ask your health care provider if a screening pelvic exam is right for you.  If you have had past treatment for cervical cancer or a condition that could lead to cancer, you need Pap tests and screening for cancer for  at least 20 years after your treatment. If Pap tests have been discontinued for you, your risk factors (such as having a new sexual partner) need to be reassessed to determine if you should start having screenings again. Some women have medical problems that increase the chance of getting cervical cancer. In these cases, your health care provider may recommend that you have screening and Pap tests more often.  If you have a family history of uterine cancer or ovarian cancer, talk with your health care provider about genetic screening.  If you have vaginal bleeding after reaching menopause, tell your health care provider.  There are currently no  reliable tests available to screen for ovarian cancer. Lung Cancer  Lung cancer screening is recommended for adults 9-57 years old who are at high risk for lung cancer because of a history of smoking. A yearly low-dose CT scan of the lungs is recommended if you:  Currently smoke.  Have a history of at least 30 pack-years of smoking and you currently smoke or have quit within the past 15 years. A pack-year is smoking an average of one pack of cigarettes per day for one year. Yearly screening should:  Continue until it has been 15 years since you quit.  Stop if you develop a health problem that would prevent you from having lung cancer treatment. Colorectal Cancer  This type of cancer can be detected and can often be prevented.  Routine colorectal cancer screening usually begins at age 40 and continues through age 45.  If you have risk factors for colon cancer, your health care provider may recommend that you be screened at an earlier age.  If you have a family history of colorectal cancer, talk with your health care provider about genetic screening.  Your health care provider may also recommend using home test kits to check for hidden blood in your stool.  A small camera at the end of a tube can be used to examine your colon directly (sigmoidoscopy or colonoscopy). This is done to check for the earliest forms of colorectal cancer.  Direct examination of the colon should be repeated every 5-10 years until age 21. However, if early forms of precancerous polyps or small growths are found or if you have a family history or genetic risk for colorectal cancer, you may need to be screened more often. Skin Cancer  Check your skin from head to toe regularly.  Monitor any moles. Be sure to tell your health care provider:  About any new moles or changes in moles, especially if there is a change in a mole's shape or color.  If you have a mole that is larger than the size of a pencil  eraser.  If any of your family members has a history of skin cancer, especially at a young age, talk with your health care provider about genetic screening.  Always use sunscreen. Apply sunscreen liberally and repeatedly throughout the day.  Whenever you are outside, protect yourself by wearing long sleeves, pants, a wide-brimmed hat, and sunglasses. What should I know about osteoporosis? Osteoporosis is a condition in which bone destruction happens more quickly than new bone creation. After menopause, you may be at an increased risk for osteoporosis. To help prevent osteoporosis or the bone fractures that can happen because of osteoporosis, the following is recommended:  If you are 62-88 years old, get at least 1,000 mg of calcium and at least 600 mg of vitamin D per day.  If you are older  than age 83 but younger than age 36, get at least 1,200 mg of calcium and at least 600 mg of vitamin D per day.  If you are older than age 85, get at least 1,200 mg of calcium and at least 800 mg of vitamin D per day. Smoking and excessive alcohol intake increase the risk of osteoporosis. Eat foods that are rich in calcium and vitamin D, and do weight-bearing exercises several times each week as directed by your health care provider. What should I know about how menopause affects my mental health? Depression may occur at any age, but it is more common as you become older. Common symptoms of depression include:  Low or sad mood.  Changes in sleep patterns.  Changes in appetite or eating patterns.  Feeling an overall lack of motivation or enjoyment of activities that you previously enjoyed.  Frequent crying spells. Talk with your health care provider if you think that you are experiencing depression. What should I know about immunizations? It is important that you get and maintain your immunizations. These include:  Tetanus, diphtheria, and pertussis (Tdap) booster vaccine.  Influenza every year  before the flu season begins.  Pneumonia vaccine.  Shingles vaccine. Your health care provider may also recommend other immunizations. This information is not intended to replace advice given to you by your health care provider. Make sure you discuss any questions you have with your health care provider. Document Released: 02/26/2005 Document Revised: 07/25/2015 Document Reviewed: 10/08/2014  2017 Elsevier   -  Low-Sodium Eating Plan Sodium raises blood pressure and causes water to be held in the body. Getting less sodium from food will help lower your blood pressure, reduce any swelling, and protect your heart, liver, and kidneys. We get sodium by adding salt (sodium chloride) to food. Most of our sodium comes from canned, boxed, and frozen foods. Restaurant foods, fast foods, and pizza are also very high in sodium. Even if you take medicine to lower your blood pressure or to reduce fluid in your body, getting less sodium from your food is important. What is my plan? Most people should limit their sodium intake to 2,300 mg a day. Your health care provider recommends that you limit your sodium intake to 2,065m___ a day. What do I need to know about this eating plan? For the low-sodium eating plan, you will follow these general guidelines:  Choose foods with a % Daily Value for sodium of less than 5% (as listed on the food label).  Use salt-free seasonings or herbs instead of table salt or sea salt.  Check with your health care provider or pharmacist before using salt substitutes.  Eat fresh foods.  Eat more vegetables and fruits.  Limit canned vegetables. If you do use them, rinse them well to decrease the sodium.  Limit cheese to 1 oz (28 g) per day.  Eat lower-sodium products, often labeled as "lower sodium" or "no salt added."  Avoid foods that contain monosodium glutamate (MSG). MSG is sometimes added to [place] and some canned foods.  Check food labels (Nutrition  Facts labels) on foods to learn how much sodium is in one serving.  Eat more home-cooked food and less restaurant, buffet, and fast food.  When eating at a restaurant, ask that your food be prepared with less salt, or no salt if possible. How do I read food labels for sodium information? The Nutrition Facts label lists the amount of sodium in one serving of the food. If you  eat more than one serving, you must multiply the listed amount of sodium by the number of servings. Food labels may also identify foods as:  Sodium free-Less than 5 mg in a serving.  Very low sodium-35 mg or less in a serving.  Low sodium-140 mg or less in a serving.  Light in sodium-50% less sodium in a serving. For example, if a food that usually has 300 mg of sodium is changed to become light in sodium, it will have 150 mg of sodium.  Reduced sodium-25% less sodium in a serving. For example, if a food that usually has 400 mg of sodium is changed to reduced sodium, it will have 300 mg of sodium. What foods can I eat? Grains  Low-sodium cereals, including oats, puffed wheat and rice, and shredded wheat cereals. Low-sodium crackers. Unsalted rice and pasta. Lower-sodium bread. Vegetables  Frozen or fresh vegetables. Low-sodium or reduced-sodium canned vegetables. Low-sodium or reduced-sodium tomato sauce and paste. Low-sodium or reduced-sodium tomato and vegetable juices. Fruits  Fresh, frozen, and canned fruit. Fruit juice. Meat and Other Protein Products  Low-sodium canned tuna and salmon. Fresh or frozen meat, poultry, seafood, and fish. Lamb. Unsalted nuts. Dried beans, peas, and lentils without added salt. Unsalted canned beans. Homemade soups without salt. Eggs. Dairy  Milk. Soy milk. Ricotta cheese. Low-sodium or reduced-sodium cheeses. Yogurt. Condiments  Fresh and dried herbs and spices. Salt-free seasonings. Onion and garlic powders. Low-sodium varieties of mustard and ketchup. Fresh or refrigerated  horseradish. Lemon juice. Fats and Oils  Reduced-sodium salad dressings. Unsalted butter. Other  Unsalted popcorn and pretzels. The items listed above may not be a complete list of recommended foods or beverages. Contact your dietitian for more options.  What foods are not recommended? Grains  Instant hot cereals. Bread stuffing, pancake, and biscuit mixes. Croutons. Seasoned rice or pasta mixes. Noodle soup cups. Boxed or frozen macaroni and cheese. Self-rising flour. Regular salted crackers. Vegetables  Regular canned vegetables. Regular canned tomato sauce and paste. Regular tomato and vegetable juices. Frozen vegetables in sauces. Salted [place] fries. Olives. Angie Fava. Relishes. Sauerkraut. Salsa. Meat and Other Protein Products  Salted, canned, smoked, spiced, or pickled meats, seafood, or fish. Bacon, ham, sausage, hot dogs, corned beef, chipped beef, and packaged luncheon meats. Salt pork. Jerky. Pickled herring. Anchovies, regular canned tuna, and sardines. Salted nuts. Dairy  Processed cheese and cheese spreads. Cheese curds. Blue cheese and cottage cheese. Buttermilk. Condiments  Onion and garlic salt, seasoned salt, table salt, and sea salt. Canned and packaged gravies. Worcestershire sauce. Tartar sauce. Barbecue sauce. Teriyaki sauce. Soy sauce, including reduced sodium. Steak sauce. Fish sauce. Oyster sauce. Cocktail sauce. Horseradish that you find on the shelf. Regular ketchup and mustard. Meat flavorings and tenderizers. Bouillon cubes. Hot sauce. Tabasco sauce. Marinades. Taco seasonings. Relishes. Fats and Oils  Regular salad dressings. Salted butter. Margarine. Ghee. Bacon fat. Other  Potato and tortilla chips. Corn chips and puffs. Salted popcorn and pretzels. Canned or dried soups. Pizza. Frozen entrees and pot pies. The items listed above may not be a complete list of foods and beverages to avoid. Contact your dietitian for more information.  This information is not  intended to replace advice given to you by your health care provider. Make sure you discuss any questions you have with your health care provider. Document Released: 06/26/2001 Document Revised: 06/12/2015 Document Reviewed: 11/08/2012 Elsevier Interactive Patient Education  2017 [employer].

## 2016-01-22 ENCOUNTER — Other Ambulatory Visit: Payer: Self-pay | Admitting: Internal Medicine

## 2016-01-22 LAB — CERVICOVAGINAL ANCILLARY ONLY: Wet Prep (BD Affirm): POSITIVE — AB

## 2016-01-22 MED ORDER — METRONIDAZOLE 500 MG PO TABS: 500.0000 mg | ORAL_TABLET | Freq: Two times a day (BID) | ORAL | 0 refills | Status: DC

## 2016-01-23 LAB — CYTOLOGY - PAP
Diagnosis: NEGATIVE
HPV: DETECTED — AB

## 2016-01-23 MED FILL — ?METRONIDAZOLE 500 MG TABLE: 500 | 7 days supply | Qty: 14 | Fill #0

## 2016-01-28 ENCOUNTER — Telehealth: Payer: Self-pay

## 2016-01-28 NOTE — Telephone Encounter (Signed)
Contacted pt to go over lab results spoke with daughter and made aware of results

## 2016-03-08 MED FILL — HYDROCHLOROTHIAZIDE 25 MG T: 25 | 30 days supply | Qty: 30 | Fill #0

## 2016-04-26 ENCOUNTER — Ambulatory Visit: Payer: Self-pay | Attending: Internal Medicine | Admitting: Internal Medicine

## 2016-04-26 VITALS — BP 134/87 | HR 71 | Temp 98.2°F | Resp 16 | Wt 120.0 lb

## 2016-04-26 DIAGNOSIS — M25512 Pain in left shoulder: Secondary | ICD-10-CM

## 2016-04-26 DIAGNOSIS — Z1239 Encounter for other screening for malignant neoplasm of breast: Secondary | ICD-10-CM

## 2016-04-26 DIAGNOSIS — Z23 Encounter for immunization: Secondary | ICD-10-CM

## 2016-04-26 DIAGNOSIS — Z1231 Encounter for screening mammogram for malignant neoplasm of breast: Secondary | ICD-10-CM

## 2016-04-26 DIAGNOSIS — I10 Essential (primary) hypertension: Secondary | ICD-10-CM

## 2016-04-26 MED ORDER — NAPROXEN 500 MG PO TABS: 500.0000 mg | ORAL_TABLET | Freq: Two times a day (BID) | ORAL | 1 refills | Status: DC

## 2016-04-26 MED ORDER — HYDROCHLOROTHIAZIDE 25 MG PO TABS: 25.0000 mg | ORAL_TABLET | Freq: Every day | ORAL | 3 refills | Status: DC

## 2016-04-26 MED FILL — HYDROCHLOROTHIAZIDE 25 MG T: 25 | 30 days supply | Qty: 30 | Fill #0

## 2016-04-26 MED FILL — NAPROXEN 500 MG TABLET: 500 | 30 days supply | Qty: 60 | Fill #0

## 2016-04-26 NOTE — Patient Instructions (Addendum)
- financial aid  - Mammogram scholarship Avoid other NSAIDS (motrin/aleve, aspirin) with Naproxen. -  Low-Sodium Eating Plan Sodium, which is an element that makes up salt, helps you maintain a healthy balance of fluids in your body. Too much sodium can increase your blood pressure and cause fluid and waste to be held in your body. Your health care provider or dietitian may recommend following this plan if you have high blood pressure (hypertension), kidney disease, liver disease, or heart failure. Eating less sodium can help lower your blood pressure, reduce swelling, and protect your heart, liver, and kidneys. What are tips for following this plan? General guidelines   Most people on this plan should limit their sodium intake to 1,500-2,000 mg (milligrams) of sodium each day. Reading food labels   The Nutrition Facts label lists the amount of sodium in one serving of the food. If you eat more than one serving, you must multiply the listed amount of sodium by the number of servings.  Choose foods with less than 140 mg of sodium per serving.  Avoid foods with 300 mg of sodium or more per serving. Shopping   Look for lower-sodium products, often labeled as "low-sodium" or "no salt added."  Always check the sodium content even if foods are labeled as "unsalted" or "no salt added".  Buy fresh foods.  Avoid canned foods and premade or frozen meals.  Avoid canned, cured, or processed meats  Buy breads that have less than 80 mg of sodium per slice. Cooking   Eat more home-cooked food and less restaurant, buffet, and fast food.  Avoid adding salt when cooking. Use salt-free seasonings or herbs instead of table salt or sea salt. Check with your health care provider or pharmacist before using salt substitutes.  Cook with plant-based oils, such as canola, sunflower, or olive oil. Meal planning   When eating at a restaurant, ask that your food be prepared with less salt or no salt, if  possible.  Avoid foods that contain MSG (monosodium glutamate). MSG is sometimes added to [place] food, bouillon, and some canned foods. What foods are recommended? The items listed may not be a complete list. Talk with your dietitian about what dietary choices are best for you. Grains  Low-sodium cereals, including oats, puffed wheat and rice, and shredded wheat. Low-sodium crackers. Unsalted rice. Unsalted pasta. Low-sodium bread. Whole-grain breads and whole-grain pasta. Vegetables  Fresh or frozen vegetables. "No salt added" canned vegetables. "No salt added" tomato sauce and paste. Low-sodium or reduced-sodium tomato and vegetable juice. Fruits  Fresh, frozen, or canned fruit. Fruit juice. Meats and other protein foods  Fresh or frozen (no salt added) meat, poultry, seafood, and fish. Low-sodium canned tuna and salmon. Unsalted nuts. Dried peas, beans, and lentils without added salt. Unsalted canned beans. Eggs. Unsalted nut butters. Dairy  Milk. Soy milk. Cheese that is naturally low in sodium, such as ricotta cheese, fresh mozzarella, or Swiss cheese Low-sodium or reduced-sodium cheese. Cream cheese. Yogurt. Fats and oils  Unsalted butter. Unsalted margarine with no trans fat. Vegetable oils such as canola or olive oils. Seasonings and other foods  Fresh and dried herbs and spices. Salt-free seasonings. Low-sodium mustard and ketchup. Sodium-free salad dressing. Sodium-free light mayonnaise. Fresh or refrigerated horseradish. Lemon juice. Vinegar. Homemade, reduced-sodium, or low-sodium soups. Unsalted popcorn and pretzels. Low-salt or salt-free chips. What foods are not recommended? The items listed may not be a complete list. Talk with your dietitian about what dietary choices are best for you.  Grains  Instant hot cereals. Bread stuffing, pancake, and biscuit mixes. Croutons. Seasoned rice or pasta mixes. Noodle soup cups. Boxed or frozen macaroni and cheese. Regular salted crackers.  Self-rising flour. Vegetables  Sauerkraut, pickled vegetables, and relishes. Olives. [place] fries. Onion rings. Regular canned vegetables (not low-sodium or reduced-sodium). Regular canned tomato sauce and paste (not low-sodium or reduced-sodium). Regular tomato and vegetable juice (not low-sodium or reduced-sodium). Frozen vegetables in sauces. Meats and other protein foods  Meat or fish that is salted, canned, smoked, spiced, or pickled. Bacon, ham, sausage, hotdogs, corned beef, chipped beef, packaged lunch meats, salt pork, jerky, pickled herring, anchovies, regular canned tuna, sardines, salted nuts. Dairy  Processed cheese and cheese spreads. Cheese curds. Blue cheese. Feta cheese. String cheese. Regular cottage cheese. Buttermilk. Canned milk. Fats and oils  Salted butter. Regular margarine. Ghee. Bacon fat. Seasonings and other foods  Onion salt, garlic salt, seasoned salt, table salt, and sea salt. Canned and packaged gravies. Worcestershire sauce. Tartar sauce. Barbecue sauce. Teriyaki sauce. Soy sauce, including reduced-sodium. Steak sauce. Fish sauce. Oyster sauce. Cocktail sauce. Horseradish that you find on the shelf. Regular ketchup and mustard. Meat flavorings and tenderizers. Bouillon cubes. Hot sauce and Tabasco sauce. Premade or packaged marinades. Premade or packaged taco seasonings. Relishes. Regular salad dressings. Salsa. Potato and tortilla chips. Corn chips and puffs. Salted popcorn and pretzels. Canned or dried soups. Pizza. Frozen entrees and pot pies. Summary  Eating less sodium can help lower your blood pressure, reduce swelling, and protect your heart, liver, and kidneys.  Most people on this plan should limit their sodium intake to 1,500-2,000 mg (milligrams) of sodium each day.  Canned, boxed, and frozen foods are high in sodium. Restaurant foods, fast foods, and pizza are also very high in sodium. You also get sodium by adding salt to food.  Try to cook at home,  eat more fresh fruits and vegetables, and eat less fast food, canned, processed, or prepared foods. This information is not intended to replace advice given to you by your health care provider. Make sure you discuss any questions you have with your health care provider. Document Released: 06/26/2001 Document Revised: 12/29/2015 Document Reviewed: 12/29/2015 Elsevier Interactive Patient Education  2017 Elsevier Inc.  -  Shoulder Pain Many things can cause shoulder pain, including:  An injury.  Moving the arm in the same way again and again (overuse).  Joint pain (arthritis). Follow these instructions at home: Take these actions to help with your pain:  Squeeze a soft ball or a foam pad as much as you can. This helps to prevent swelling. It also makes the arm stronger.  Take over-the-counter and prescription medicines only as told by your doctor.  If told, put ice on the area:  Put ice in a plastic bag.  Place a towel between your skin and the bag.  Leave the ice on for 20 minutes, 2-3 times per day. Stop putting on ice if it does not help with the pain.  If you were given a shoulder sling or immobilizer:  Wear it as told.  Remove it to shower or bathe.  Move your arm as little as possible.  Keep your hand moving. This helps prevent swelling. Contact a doctor if:  Your pain gets worse.  Medicine does not help your pain.  You have new pain in your arm, hand, or fingers. Get help right away if:  Your arm, hand, or fingers:  Tingle.  Are numb.  Are swollen.  Are  painful.  Turn white or blue. This information is not intended to replace advice given to you by your health care provider. Make sure you discuss any questions you have with your health care provider. Document Released: 06/23/2007 Document Revised: 08/31/2015 Document Reviewed: 04/29/2014 Elsevier Interactive Patient Education  2017 Elsevier Inc.  Td Vaccine (Tetanus and Diphtheria): What You Need  to Know 1. Why get vaccinated? Tetanus  and diphtheria are very serious diseases. They are rare in the [place] today, but people who do become infected often have severe complications. Td vaccine is used to protect adolescents and adults from both of these diseases. Both tetanus and diphtheria are infections caused by bacteria. Diphtheria spreads from person to person through coughing or sneezing. Tetanus-causing bacteria enter the body through cuts, scratches, or wounds. TETANUS (lockjaw) causes painful muscle tightening and stiffness, usually all over the body.  It can lead to tightening of muscles in the head and neck so you can't open your mouth, swallow, or sometimes even breathe. Tetanus kills about 1 out of every 10 people who are infected even after receiving the best medical care. DIPHTHERIA can cause a thick coating to form in the back of the throat.  It can lead to breathing problems, paralysis, heart failure, and death. Before vaccines, as many as 200,000 cases of diphtheria and hundreds of cases of tetanus were reported in the [place] each year. Since vaccination began, reports of cases for both diseases have dropped by about 99%. 2. Td vaccine Td vaccine can protect adolescents and adults from tetanus and diphtheria. Td is usually given as a booster dose every 10 years but it can also be given earlier after a severe and dirty wound or burn. Another vaccine, called Tdap, which protects against pertussis in addition to tetanus and diphtheria, is sometimes recommended instead of Td vaccine. Your doctor or the person giving you the vaccine can give you more information. Td may safely be given at the same time as other vaccines. 3. Some people should not get this vaccine  A person who has ever had a life-threatening allergic reaction after a previous dose of any tetanus or diphtheria containing vaccine, OR has a severe allergy to any part of this vaccine, should not get Td  vaccine. Tell the person giving the vaccine about any severe allergies.  Talk to your doctor if you:  had severe pain or swelling after any vaccine containing diphtheria or tetanus,  ever had a condition called Guillain Barre Syndrome (GBS),  aren't feeling well on the day the shot is scheduled. 4. What are the risks from Td vaccine? With any medicine, including vaccines, there is a chance of side effects. These are usually mild and go away on their own. Serious reactions are also possible but are rare. Most people who get Td vaccine do not have any problems with it. Mild problems following Td vaccine:  (Did not interfere with activities)  Pain where the shot was given (about 8 people in 10)  Redness or swelling where the shot was given (about 1 person in 4)  Mild fever (rare)  Headache (about 1 person in 4)  Tiredness (about 1 person in 4) Moderate problems following Td vaccine:  (Interfered with activities, but did not require medical attention)  Fever over 102F (rare) Severe problems following Td vaccine:  (Unable to perform usual activities; required medical attention)  Swelling, severe pain, bleeding and/or redness in the arm where the shot was given (rare). Problems that could  happen after any vaccine:   People sometimes faint after a medical procedure, including vaccination. Sitting or lying down for about 15 minutes can help prevent fainting, and injuries caused by a fall. Tell your doctor if you feel dizzy, or have vision changes or ringing in the ears.  Some people get severe pain in the shoulder and have difficulty moving the arm where a shot was given. This happens very rarely.  Any medication can cause a severe allergic reaction. Such reactions from a vaccine are very rare, estimated at fewer than 1 in a million doses, and would happen within a few minutes to a few hours after the vaccination. As with any medicine, there is a very remote chance of a vaccine  causing a serious injury or death. The safety of vaccines is always being monitored. For more information, visit: http://floyd.org/ 5. What if there is a serious reaction? What should I look for?  Look for anything that concerns you, such as signs of a severe allergic reaction, very high fever, or unusual behavior. Signs of a severe allergic reaction can include hives, swelling of the face and throat, difficulty breathing, a fast heartbeat, dizziness, and weakness. These would usually start a few minutes to a few hours after the vaccination. What should I do?   If you think it is a severe allergic reaction or other emergency that can't wait, call 9-1-1 or get the person to the nearest hospital. Otherwise, call your doctor.  Afterward, the reaction should be reported to the Vaccine Adverse Event Reporting System (VAERS). Your doctor might file this report, or you can do it yourself through the VAERS web site at www.vaers.LAgents.no, or by calling 1-289-111-5815.  VAERS does not give medical advice. 6. The National Vaccine Injury Compensation Program The [employer] Vaccine Injury Compensation Program (VICP) is a federal program that was created to compensate people who may have been injured by certain vaccines. Persons who believe they may have been injured by a vaccine can learn about the program and about filing a claim by calling 1-(303)061-3835 or visiting the VICP website at SpiritualWord.at. There is a time limit to file a claim for compensation. 7. How can I learn more?  Ask your doctor. He or she can give you the vaccine package insert or suggest other sources of information.  Call your local or state health department.  Contact the Centers for Disease Control and Prevention (CDC):  Call 301-245-9658 (1-800-CDC-INFO)  Visit CDC's website at PicCapture.uy CDC Td Vaccine VIS (04/29/15) This information is not intended to replace advice given to you by your  health care provider. Make sure you discuss any questions you have with your health care provider. Document Released: 11/01/2005 Document Revised: 09/25/2015 Document Reviewed: 09/25/2015 Elsevier Interactive Patient Education  2017 [employer].

## 2016-04-26 NOTE — Progress Notes (Signed)
Ashley Pena, is a 61 y.o. female  [id number]  [id number]  DOB - 1955-09-25  Chief Complaint  Patient presents with  . Hypertension  . Shoulder Pain        Subjective:   Ashley Pena is a 61 y.o. female here today for a follow up visit for left shoulder pain, describes as intermittent. Notes tried motrin and tylenol w/ minimal improvement. Pain comes /goes, esp w/ rotation of shoulder. Denies heavy lifting/trauma, right handed.   Patient has No headache, No chest pain, No abdominal pain - No Nausea, No new weakness tingling or numbness, No Cough - SOB.  Here w/ her grown dgt.  No problems updated.  ALLERGIES: No Known Allergies  PAST MEDICAL HISTORY: Past Medical History:  Diagnosis Date  . Hypertension     MEDICATIONS AT HOME: Prior to Admission medications   Medication Sig Start Date End Date Taking? Authorizing Provider  hydrochlorothiazide (HYDRODIURIL) 25 MG tablet Take 1 tablet (25 mg total) by mouth daily. 04/26/16   Pete Glatter, MD  ibuprofen (ADVIL,MOTRIN) 200 MG tablet Take 200 mg by mouth every 6 (six) hours as needed for moderate pain.    Historical Provider, MD  metroNIDAZOLE (FLAGYL) 500 MG tablet Take 1 tablet (500 mg total) by mouth 2 (two) times daily. Patient not taking: Reported on 04/26/2016 01/22/16   Pete Glatter, MD  naproxen (NAPROSYN) 500 MG tablet Take 1 tablet (500 mg total) by mouth 2 (two) times daily with a meal. 04/26/16   Pete Glatter, MD     Objective:   Vitals:   04/26/16 1050  BP: 134/87  Pulse: 71  Resp: 16  Temp: 98.2 F (36.8 C)  TempSrc: Oral  SpO2: 96%  Weight: 120 lb (54.4 kg)    Exam General appearance : Awake, alert, not in any distress. Speech Clear. Not toxic looking HEENT: Atraumatic and Normocephalic, pupils equally reactive to light. Neck: supple, no JVD. No cervical lymphadenopathy.  Chest:Good air entry bilaterally, no added sounds. CVS: S1 S2 regular, no murmurs/gallups or rubs. Abdomen:  Bowel sounds active, Non tender and not distended with no gaurding, rigidity or rebound. Extremities: B/L Lower Ext shows no edema, both legs are warm to touch Left shoulder - mild ttp diffusely on palpation of shoulder, esp rotator cuff, but rom intact.   No scoliosis on exam. Neurology: Awake alert, and oriented X 3, CN II-XII grossly intact, Non focal Skin:No Rash  Data Review No results found for: HGBA1C  Depression screen [facility] 2/9 04/26/2016 01/21/2016 10/20/2015 06/01/2013 02/20/2013  Decreased Interest 1 0 1 0 0  Down, Depressed, Hopeless 0 0 1 0 0  PHQ - 2 Score 1 0 2 0 0  Altered sleeping - - 2 - -  Tired, decreased energy - - 0 - -  Change in appetite - - 0 - -  Feeling bad or failure about yourself  - - 0 - -  Trouble concentrating - - 0 - -  Moving slowly or fidgety/restless - - 0 - -  Suicidal thoughts - - 0 - -  PHQ-9 Score - - 4 - -      Assessment & Plan   1. Left shoulder pain, unspecified chronicity - suspect arthritic - DG Shoulder Left; Future - trial naprozen bid for now w/ food. - avoid other nsaids for now.  2. Breast cancer screening Encouraged, last exam 2015, we have ordered multiple times but she has still not had completed. - MM Digital Screening;  Future - scholarship forms  3. HTN (hypertension), benign Low salt diet encouraged, continue hctz 25 qd  4. Still needs colonoscopy - recd financial aid packet  5. tdap today.     Patient have been counseled extensively about nutrition and exercise  Return in about 3 months (around 07/26/2016), or if symptoms worsen or fail to improve.  The patient was given clear instructions to go to ER or return to medical center if symptoms don't improve, worsen or new problems develop. The patient verbalized understanding. The patient was told to call to get lab results if they haven't heard anything in the next week.   This note has been created with Education officer, environmental.  Any transcriptional errors are unintentional.   Pete Glatter, MD, MBA/MHA [facility] [place], [place] [phone]   04/26/2016, 11:19 AM

## 2016-06-03 ENCOUNTER — Encounter: Payer: Self-pay | Admitting: Internal Medicine

## 2016-06-04 ENCOUNTER — Encounter: Payer: Self-pay | Admitting: Internal Medicine

## 2016-06-07 ENCOUNTER — Encounter: Payer: Self-pay | Admitting: Internal Medicine

## 2016-12-13 ENCOUNTER — Encounter: Payer: Self-pay | Admitting: Nurse Practitioner

## 2016-12-13 ENCOUNTER — Ambulatory Visit: Payer: Self-pay | Attending: Nurse Practitioner | Admitting: Nurse Practitioner

## 2016-12-13 VITALS — BP 121/76 | HR 94 | Temp 98.4°F | Resp 12 | Ht 60.63 in | Wt 118.0 lb

## 2016-12-13 DIAGNOSIS — M25512 Pain in left shoulder: Secondary | ICD-10-CM

## 2016-12-13 DIAGNOSIS — I10 Essential (primary) hypertension: Secondary | ICD-10-CM

## 2016-12-13 DIAGNOSIS — M25511 Pain in right shoulder: Secondary | ICD-10-CM

## 2016-12-13 DIAGNOSIS — Z791 Long term (current) use of non-steroidal anti-inflammatories (NSAID): Secondary | ICD-10-CM | POA: Insufficient documentation

## 2016-12-13 DIAGNOSIS — Z79899 Other long term (current) drug therapy: Secondary | ICD-10-CM | POA: Insufficient documentation

## 2016-12-13 DIAGNOSIS — Z9889 Other specified postprocedural states: Secondary | ICD-10-CM | POA: Insufficient documentation

## 2016-12-13 DIAGNOSIS — Z1211 Encounter for screening for malignant neoplasm of colon: Secondary | ICD-10-CM

## 2016-12-13 MED ORDER — HYDROCHLOROTHIAZIDE 25 MG PO TABS: 25.0000 mg | ORAL_TABLET | Freq: Every day | ORAL | 3 refills | Status: DC

## 2016-12-13 MED ORDER — TIZANIDINE HCL 2 MG PO CAPS: 2.0000 mg | ORAL_CAPSULE | Freq: Three times a day (TID) | ORAL | 1 refills | Status: AC

## 2016-12-13 MED FILL — ?TIZANIDINE HCL 2 MG TABS: 2 | 14 days supply | Qty: 42 | Fill #0

## 2016-12-13 MED FILL — HYDROCHLOROTHIAZIDE 25 MG T: 25 | 30 days supply | Qty: 30 | Fill #0

## 2016-12-13 NOTE — Patient Instructions (Addendum)
Adhesive Capsulitis Adhesive capsulitis is inflammation of the tendons and ligaments that surround the shoulder joint (shoulder capsule). This condition causes the shoulder to become stiff and painful to move. Adhesive capsulitis is also called frozen shoulder. What are the causes? This condition may be caused by:  An injury to the shoulder joint.  Straining the shoulder.  Not moving the shoulder for a period of time. This can happen if your arm was injured or in a sling.  Long-standing health problems, such as: ? Diabetes. ? Thyroid problems. ? Heart disease. ? Stroke. ? Rheumatoid arthritis. ? Lung disease.  In some cases, the cause may not be known. What increases the risk? This condition is more likely to develop in:  Women.  People who are older than 63 years of age.  What are the signs or symptoms? Symptoms of this condition include:  Pain in the shoulder when moving the arm. There may also be pain when parts of the shoulder are touched. The pain is worse at night or when at rest.  Soreness or aching in the shoulder.  Inability to move the shoulder normally.  Muscle spasms.  How is this diagnosed? This condition is diagnosed with a physical exam and imaging tests, such as an X-ray or MRI. How is this treated? This condition may be treated with:  Treatment of the underlying cause or condition.  Physical therapy. This involves performing exercises to get the shoulder moving again.  Medicine. Medicine may be given to relieve pain, inflammation, or muscle spasms.  Steroid injections into the shoulder joint.  Shoulder manipulation. This is a procedure to move the shoulder into another position. It is done after you are given a medicine to make you fall asleep (general anesthetic). The joint may also be injected with salt water at high pressure to break down scarring.  Surgery. This may be done in severe cases when other treatments have failed.  Although most  people recover completely from adhesive capsulitis, some may not regain the full movement of the shoulder. Follow these instructions at home:  Take over-the-counter and prescription medicines only as told by your health care provider.  If you are being treated with physical therapy, follow instructions from your physical therapist.  Avoid exercises that put a lot of demand on your shoulder, such as throwing. These exercises can make pain worse.  If directed, apply ice to the injured area: ? Put ice in a plastic bag. ? Place a towel between your skin and the bag. ? Leave the ice on for 20 minutes, 2-3 times per day. Contact a health care provider if:  You develop new symptoms.  Your symptoms get worse. This information is not intended to replace advice given to you by your health care provider. Make sure you discuss any questions you have with your health care provider. Document Released: 11/01/2008 Document Revised: 06/12/2015 Document Reviewed: 04/29/2014 Elsevier Interactive Patient Education  2018 Elsevier Inc.  

## 2016-12-13 NOTE — Progress Notes (Addendum)
Assessment & Plan:  Ashley Pena was seen today for establish care.  Diagnoses and all orders for this visit:  Acute pain of both shoulders -     Ambulatory referral to Physical Therapy -     tizanidine (ZANAFLEX) 2 MG capsule; Take 1 capsule (2 mg total) by mouth 3 (three) times daily for 14 days. -     DG Shoulder Left; Future -     DG Shoulder Right; Future May alternate heat and ice to affected areas Make sure to exercise your arms as much as possible. Restricting movement results in limited muscle movement and increased pain when attempting to perform regular activities.   Essential hypertension -     hydrochlorothiazide (HYDRODIURIL) 25 MG tablet; Take 1 tablet (25 mg total) by mouth daily. Continue all antihypertensives as prescribed.  Remember to bring in your blood pressure log with you for your follow up appointment.  DASH/Mediterranean Diets are healthier choices for HTN.     Patient has been counseled on age-appropriate routine health concerns for screening and prevention. These are reviewed and up-to-date. Referrals have been placed accordingly. Immunizations are up-to-date or declined.    Subjective:   Chief Complaint  Patient presents with  . Establish Care    Patient stated both of her shoulder are in pain.    [name] 61 y.o. female presents to office today to establish care as a new patient. She is accompanied by her granddaughter.   Essential Hypertension Endorses medication compliance. Blood pressure is normotensive today. She denies chest pain, shortness of breath, lightheadedness, palpitations or BLE edema.  Bilateral Shoulder pain Onset of shoulder pain was over a year ago. She takes regular strength Tylenol and Aleve for pain which provides little relief of symptoms.  She reports falling as a teenager when she was pregnant and injuring her shoulders but never received treatment. She denies the use of ice or heat on her shoulders and extends down into  her elbows. Pain occurs every day. Describes pain as sharp 9/10. She has been seen several times in the office over the past year with complaints of left shoulder pain. There was an xray ordered of her left shoulder at her last visit in April 2018 however she failed to have xray performed.    Review of Systems  Constitutional: Negative for fever, malaise/fatigue and weight loss.  HENT: Negative.  Negative for nosebleeds.   Eyes: Negative.  Negative for blurred vision, double vision and photophobia.  Respiratory: Negative.  Negative for cough and shortness of breath.   Cardiovascular: Negative.  Negative for chest pain, palpitations and leg swelling.  Gastrointestinal: Negative.  Negative for abdominal pain, constipation, diarrhea, heartburn, nausea and vomiting.  Musculoskeletal: Positive for back pain, myalgias and neck pain.  Neurological: Negative.  Negative for dizziness, focal weakness, seizures and headaches.  Endo/Heme/Allergies: Negative for environmental allergies.  Psychiatric/Behavioral: Negative.  Negative for suicidal ideas.    Past Medical History:  Diagnosis Date  . Hypertension     Past Surgical History:  Procedure Laterality Date  . CESAREAN SECTION      History reviewed. No pertinent family history.  Social History Reviewed with no changes to be made today.   Outpatient Medications Prior to Visit  Medication Sig Dispense Refill  . ibuprofen (ADVIL,MOTRIN) 200 MG tablet Take 200 mg by mouth every 6 (six) hours as needed for moderate pain.    . hydrochlorothiazide (HYDRODIURIL) 25 MG tablet Take 1 tablet (25 mg total) by mouth daily. (  Patient not taking: Reported on 12/13/2016) 90 tablet 3  . metroNIDAZOLE (FLAGYL) 500 MG tablet Take 1 tablet (500 mg total) by mouth 2 (two) times daily. (Patient not taking: Reported on 04/26/2016) 14 tablet 0  . naproxen (NAPROSYN) 500 MG tablet Take 1 tablet (500 mg total) by mouth 2 (two) times daily with a meal. (Patient not  taking: Reported on 12/13/2016) 60 tablet 1   No facility-administered medications prior to visit.     No Known Allergies     Objective:    BP 121/76 (BP Location: Right Arm, Patient Position: Sitting, Cuff Size: Normal)   Pulse 94   Temp 98.4 F (36.9 C) (Oral)   Resp 12   Ht 5' 0.63" (1.54 m)   Wt 118 lb (53.5 kg)   SpO2 97%   BMI 22.57 kg/m  Wt Readings from Last 3 Encounters:  12/13/16 118 lb (53.5 kg)  04/26/16 120 lb (54.4 kg)  01/21/16 125 lb 3.2 oz (56.8 kg)    Physical Exam  Constitutional: She is oriented to person, place, and time. She appears well-developed and well-nourished. She is cooperative.  HENT:  Head: Normocephalic and atraumatic.  Eyes: EOM are normal.  Neck: Normal range of motion.  Cardiovascular: Normal rate, regular rhythm, normal heart sounds and intact distal pulses. Exam reveals no gallop and no friction rub.  No murmur heard. Pulmonary/Chest: Effort normal and breath sounds normal. No tachypnea. No respiratory distress. She has no decreased breath sounds. She has no wheezes. She has no rhonchi. She has no rales. She exhibits no tenderness.  Abdominal: Soft. Bowel sounds are normal.  Musculoskeletal: She exhibits no edema.       Right shoulder: She exhibits pain.       Left shoulder: She exhibits decreased range of motion and pain.       Right upper arm: She exhibits tenderness. She exhibits no bony tenderness and no swelling.       Left upper arm: She exhibits tenderness. She exhibits no bony tenderness and no swelling.       Arms: Neurological: She is alert and oriented to person, place, and time. Coordination normal.  Skin: Skin is warm and dry.  Psychiatric: She has a normal mood and affect. Her behavior is normal. Judgment and thought content normal.  Nursing note and vitals reviewed.      Patient has been counseled extensively about nutrition and exercise as well as the importance of adherence with medications and regular follow-up.  The patient was given clear instructions to go to ER or return to medical center if symptoms don't improve, worsen or new problems develop. The patient verbalized understanding.   Follow-up: Return in about 4 weeks (around 01/10/2017).   Claiborne RiggZelda W Zamyah Wiesman, FNP-BC [facility] [place], [place] [phone]   12/13/2016, 3:26 PM

## 2017-01-07 ENCOUNTER — Ambulatory Visit
Admission: RE | Admit: 2017-01-07 | Discharge: 2017-01-07 | Disposition: A | Discharge status: Home / Self Care | Payer: Self-pay | Source: Ambulatory Visit | Attending: Nurse Practitioner | Admitting: Nurse Practitioner

## 2017-01-07 DIAGNOSIS — M25512 Pain in left shoulder: Principal | ICD-10-CM

## 2017-01-07 DIAGNOSIS — M25511 Pain in right shoulder: Secondary | ICD-10-CM

## 2017-01-19 ENCOUNTER — Telehealth: Payer: Self-pay

## 2017-01-19 ENCOUNTER — Ambulatory Visit: Payer: Self-pay | Admitting: Nurse Practitioner

## 2017-01-19 NOTE — Telephone Encounter (Signed)
CMA called patient and spoke to patient's granddaughter. Patient's granddaughter will make a mammogram appointment for patient.

## 2017-01-19 NOTE — Telephone Encounter (Signed)
-----   Message from Claiborne RiggZelda W Fleming, NP sent at 01/18/2017 10:46 PM EST ----- Xray of right shoulder shows arthritis. Please make sure you schedule to have your mammogram completed as soon as possible.

## 2017-01-19 NOTE — Telephone Encounter (Signed)
Patient's granddaughter informed on result and will schedule for mammogram.

## 2017-01-19 NOTE — Telephone Encounter (Signed)
-----   Message from Zelda W Fleming, NP sent at 01/18/2017 10:46 PM EST ----- Xray of right shoulder shows arthritis. Please make sure you schedule to have your mammogram completed as soon as possible. 

## 2017-01-19 NOTE — Telephone Encounter (Signed)
-----   Message from Claiborne RiggZelda W Fleming, NP sent at 01/18/2017 10:32 PM EST ----- Xray shows arthritis in shoulder.

## 2017-02-01 ENCOUNTER — Encounter: Payer: Self-pay | Admitting: Nurse Practitioner

## 2017-02-01 ENCOUNTER — Ambulatory Visit: Payer: Self-pay | Attending: Nurse Practitioner | Admitting: Nurse Practitioner

## 2017-02-01 VITALS — BP 143/92 | HR 86 | Temp 97.8°F | Ht 59.0 in | Wt 119.6 lb

## 2017-02-01 DIAGNOSIS — M25512 Pain in left shoulder: Secondary | ICD-10-CM | POA: Insufficient documentation

## 2017-02-01 DIAGNOSIS — M25511 Pain in right shoulder: Secondary | ICD-10-CM | POA: Insufficient documentation

## 2017-02-01 DIAGNOSIS — G8929 Other chronic pain: Secondary | ICD-10-CM | POA: Insufficient documentation

## 2017-02-01 DIAGNOSIS — I10 Essential (primary) hypertension: Secondary | ICD-10-CM | POA: Insufficient documentation

## 2017-02-01 MED ORDER — DICLOFENAC SODIUM 1 % TD GEL: 4.0000 g | Freq: Four times a day (QID) | TRANSDERMAL | 1 refills | Status: AC

## 2017-02-01 MED FILL — DICLOFENAC SODIUM 1% GEL: 1 | 6 days supply | Qty: 100 | Fill #0

## 2017-02-01 NOTE — Progress Notes (Signed)
Assessment & Plan:  Ashley Pena was seen today for follow-up.  Diagnoses and all orders for this visit:  Chronic pain of both shoulders -     diclofenac sodium (VOLTAREN) 1 % GEL; Apply 4 g topically 4 (four) times daily. May apply heat and alternate with ice to affected areas  Essential hypertension -     CBC -     Basic metabolic panel -     Lipid panel    Patient has been counseled on age-appropriate routine health concerns for screening and prevention. These are reviewed and up-to-date. Referrals have been placed accordingly. Immunizations are up-to-date or declined.    Subjective:   Chief Complaint  Patient presents with  . Follow-up    Patient is here for a follow-up. Patient stated both of her shoulder are still in pain. Patient stated she need medication refills.Patient stated she is fasting for lab.     [name] 62 y.o. female presents to office today for follow up to bilateral shoulder pain. She is accompanied by her daughter today who is translating for her.   Essential Hypertension Blood pressure is elevated today however she did not take her HCTZ as she reports she was fasting for labs and she can not take her blood pressure medication on an empty stomach as she will have nausea. She endorses medication compliance. Denies chest pain, shortness of breath, palpitations, lightheadedness, dizziness, headaches or BLE edema. Blood pressure has been well controlled prior to today's reading. BP Readings from Last 3 Encounters:  02/01/17 (!) 143/92  12/13/16 121/76  04/26/16 134/87    Bilateral Shoulder Pain She was evaluated by me on 12-13-2016 for chronic B/L shoulder pain. 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 I referred her for PT at that time and also prescribed  Zanaflex and recommended RICE therapy at home. Apparently I am just finding out today that her daughter (another daughter who is not present today) was contacted by  PT however she reports when she attempted to return their call on her lunch break she was unable to speak with anyone. Unfortunately at this time Ms. Ashley Pena's 100% [facility] [employer] for financial assistance has expired. She will need to re apply and then contact OP PT. Will try voltaren gel today as another option for pain. Zanaflex provided little relief of pain.    Review of Systems  Constitutional: Negative for chills, fever, malaise/fatigue and weight loss.  Respiratory: Negative.  Negative for cough, sputum production and shortness of breath.   Cardiovascular: Negative.  Negative for chest pain, orthopnea and leg swelling.  Musculoskeletal: Positive for joint pain, myalgias and neck pain.  Neurological: Negative.  Negative for dizziness, focal weakness and headaches.  Psychiatric/Behavioral: Negative.  Negative for depression. The patient is not nervous/anxious.     Past Medical History:  Diagnosis Date  . Hypertension     Past Surgical History:  Procedure Laterality Date  . CESAREAN SECTION      History reviewed. No pertinent family history.  Social History Reviewed with no changes to be made today.   Outpatient Medications Prior to Visit  Medication Sig Dispense Refill  . hydrochlorothiazide (HYDRODIURIL) 25 MG tablet Take 1 tablet (25 mg total) by mouth daily. 90 tablet 3  . ibuprofen (ADVIL,MOTRIN) 200 MG tablet Take 200 mg by mouth every 6 (six) hours as needed for moderate pain.     No facility-administered medications prior to visit.     No Known  Allergies     Objective:    BP (!) 143/92 (BP Location: Right Arm, Patient Position: Sitting, Cuff Size: Normal)   Pulse 86   Temp 97.8 F (36.6 C) (Oral)   Ht 4\' 11"  (1.499 m)   Wt 119 lb 9.6 oz (54.3 kg)   SpO2 98%   BMI 24.16 kg/m  Wt Readings from Last 3 Encounters:  02/01/17 119 lb 9.6 oz (54.3 kg)  12/13/16 118 lb (53.5 kg)  04/26/16 120 lb (54.4 kg)    Physical Exam  Constitutional: She is oriented to  person, place, and time. She appears well-developed and well-nourished.  HENT:  Head: Normocephalic.  Eyes: EOM are normal.  Neck: Normal range of motion.  Cardiovascular: Normal rate, regular rhythm and normal heart sounds.  Pulmonary/Chest: Effort normal and breath sounds normal. No respiratory distress. She has no wheezes. She has no rales. She exhibits no tenderness.  Musculoskeletal:       Right shoulder: She exhibits pain (with passive full adduction of arm past midline). She exhibits normal range of motion and no spasm.       Left shoulder: She exhibits decreased range of motion and pain (with passive adduction, abduction and extension of arm past midaxillary point).  Neurological: She is alert and oriented to person, place, and time.  Skin: Skin is warm and dry.  Psychiatric: She has a normal mood and affect. Her behavior is normal. Judgment and thought content normal.       Patient has been counseled extensively about nutrition and exercise as well as the importance of adherence with medications and regular follow-up. The patient was given clear instructions to go to ER or return to medical center if symptoms don't improve, worsen or new problems develop. The patient verbalized understanding.   Follow-up: Return if symptoms worsen or fail to improve.   Claiborne Rigg, FNP-BC [facility] [place], [place] [phone]   02/01/2017, 2:06 PM

## 2017-02-01 NOTE — Patient Instructions (Addendum)
[facility]  [address]  [place], [place] [id number] [phone]  PLEASE CALL REGARDING ORDER FOR PT EVAL

## 2017-02-02 LAB — BASIC METABOLIC PANEL
BUN/Creatinine Ratio: 11 — ABNORMAL LOW (ref 12–28)
BUN: 8 mg/dL (ref 8–27)
CALCIUM: 9.6 mg/dL (ref 8.7–10.3)
CHLORIDE: 106 mmol/L (ref 96–106)
CO2: 21 mmol/L (ref 20–29)
CREATININE: 0.76 mg/dL (ref 0.57–1.00)
GFR calc Af Amer: 97 mL/min/{1.73_m2} (ref >59)
GFR calc non Af Amer: 84 mL/min/{1.73_m2} (ref >59)
GLUCOSE: 66 mg/dL (ref 65–99)
Potassium: 4 mmol/L (ref 3.5–5.2)
Sodium: 143 mmol/L (ref 134–144)

## 2017-02-02 LAB — CBC
HEMOGLOBIN: 11.7 g/dL (ref 11.1–15.9)
Hematocrit: 36.3 % (ref 34.0–46.6)
MCH: 27.8 pg (ref 26.6–33.0)
MCHC: 32.2 g/dL (ref 31.5–35.7)
MCV: 86 fL (ref 79–97)
Platelets: 298 10*3/uL (ref 150–379)
RBC: 4.21 x10E6/uL (ref 3.77–5.28)
RDW: 14.6 % (ref 12.3–15.4)
WBC: 5 10*3/uL (ref 3.4–10.8)

## 2017-02-02 LAB — LIPID PANEL
CHOLESTEROL TOTAL: 193 mg/dL (ref 100–199)
Chol/HDL Ratio: 3.2 ratio (ref 0.0–4.4)
HDL: 61 mg/dL (ref >39)
LDL CALC: 119 mg/dL — AB (ref 0–99)
Triglycerides: 64 mg/dL (ref 0–149)
VLDL CHOLESTEROL CAL: 13 mg/dL (ref 5–40)

## 2017-02-07 ENCOUNTER — Ambulatory Visit: Payer: Self-pay | Admitting: Nurse Practitioner

## 2017-02-09 ENCOUNTER — Ambulatory Visit: Payer: Self-pay | Admitting: Physical Therapy

## 2017-02-09 ENCOUNTER — Other Ambulatory Visit: Payer: Self-pay | Admitting: Obstetrics and Gynecology

## 2017-02-09 ENCOUNTER — Telehealth: Payer: Self-pay

## 2017-02-09 DIAGNOSIS — Z1231 Encounter for screening mammogram for malignant neoplasm of breast: Secondary | ICD-10-CM

## 2017-02-09 NOTE — Telephone Encounter (Signed)
CMA called patient regarding lab result and PCP advising.   Patient's granddaughter will interpret the lab result to patient.

## 2017-02-09 NOTE — Telephone Encounter (Signed)
-----   Message from Claiborne RiggZelda W Fleming, NP sent at 02/07/2017 10:43 PM EST ----- Tests show increased LDL level. Work on a low fat, heart healthy diet and participate in regular aerobic exercise program to control as well by working out at least 150 minutes per week. No fried foods. No junk foods, sodas, sugary drinks, unhealthy snacking, or smoking.

## 2017-03-10 ENCOUNTER — Ambulatory Visit (HOSPITAL_COMMUNITY): Payer: Self-pay

## 2017-03-25 MED FILL — DICLOFENAC SODIUM 1% GEL: 1 | 6 days supply | Qty: 100 | Fill #1

## 2017-04-12 ENCOUNTER — Ambulatory Visit: Payer: Self-pay | Attending: Nurse Practitioner

## 2017-05-05 ENCOUNTER — Encounter (HOSPITAL_COMMUNITY): Payer: Self-pay

## 2017-05-05 ENCOUNTER — Ambulatory Visit
Admission: RE | Admit: 2017-05-05 | Discharge: 2017-05-05 | Disposition: A | Discharge status: Home / Self Care | Payer: No Typology Code available for payment source | Source: Ambulatory Visit | Attending: Obstetrics and Gynecology | Admitting: Obstetrics and Gynecology

## 2017-05-05 ENCOUNTER — Ambulatory Visit (HOSPITAL_COMMUNITY)
Admission: RE | Admit: 2017-05-05 | Discharge: 2017-05-05 | Disposition: A | Discharge status: Home / Self Care | Payer: Self-pay | Source: Ambulatory Visit | Attending: Obstetrics and Gynecology | Admitting: Obstetrics and Gynecology

## 2017-05-05 VITALS — BP 108/64

## 2017-05-05 DIAGNOSIS — Z1231 Encounter for screening mammogram for malignant neoplasm of breast: Secondary | ICD-10-CM

## 2017-05-05 DIAGNOSIS — Z01419 Encounter for gynecological examination (general) (routine) without abnormal findings: Secondary | ICD-10-CM

## 2017-05-05 NOTE — Progress Notes (Signed)
No complaints today.   Pap Smear: Pap smear completed today. [facility] and normal with positive HPV. Per patient has no history of an abnormal Pap smear. Last Pap smear result is in Epic.  Physical exam: Breasts Breasts symmetrical. No skin abnormalities bilateral breasts. No nipple retraction bilateral breasts. No nipple discharge bilateral breasts. No lymphadenopathy. No lumps palpated bilateral breasts. No complaints of pain or tenderness on exam. [facility]. Appointment scheduled for Thursday, May 05, 2017 at 1440.        Pelvic/Bimanual   Ext Genitalia No lesions, no swelling and no discharge observed on external genitalia.         Vagina Vagina pink and normal texture. No lesions or discharge observed in vagina.          Cervix Cervix is present. Cervix pink and of normal texture. No discharge observed.     Uterus Uterus is present and palpable. Uterus in normal position and normal size.        Adnexae Bilateral ovaries present and palpable. No tenderness on palpation.         Rectovaginal No rectal exam completed today since patient had no rectal complaints. No skin abnormalities observed on exam.    Smoking History: Patient has never smoked.  Patient Navigation: Patient education provided. Access to services provided for patient through [facility] program. No [place] interpreter was available. Patient's granddaughter signed interpreter form to interpret for patient.  Colorectal Cancer Screening: Per patient has never had a colonoscopy completed. No complaints today. FIT Test given to patient to complete and return to [facility].  Breast and Cervical Cancer Risk Assessment: Patient has no family history of breast cancer, known genetic mutations, or radiation treatment to the chest before age 64. Patient has no history of cervical dysplasia,  immunocompromised, or DES exposure in-utero.  Used patients granddaughter Ashley Pena to interpret [place] for patient. Signed consent to be scanned into Epic.

## 2017-05-05 NOTE — Patient Instructions (Signed)
Explained breast self awareness with Ashley Pena. Let patient know that if today's Pap smear is normal that her next Pap smear is due in one year due to her last Pap smear was positive for HPV. [facility] mamm[facility]. Appointment scheduled for Thursday, May 05, 2017 at 1440.  Let patient know will follow up with her within the next couple weeks with results with results of Pap smear by letter or phone. Informed patient that the Breast Center will follow-up with her within the next couple of weeks with results of mammogram by letter or phone. Ashley Pena verbalized understanding.  [place], Kathaleen Maserhristine Poll, RN 1:34 PM

## 2017-05-09 ENCOUNTER — Other Ambulatory Visit: Payer: Self-pay

## 2017-05-10 LAB — CYTOLOGY - PAP: HPV: DETECTED — AB

## 2017-05-12 ENCOUNTER — Telehealth (HOSPITAL_COMMUNITY): Payer: Self-pay | Admitting: *Deleted

## 2017-05-12 LAB — FECAL OCCULT BLOOD, IMMUNOCHEMICAL: FECAL OCCULT BLD: NEGATIVE

## 2017-05-12 NOTE — Telephone Encounter (Signed)
Telephoned patient at home number and left [facility] Need to inform patient of abnormal pap smear results and patient [facility]

## 2017-05-16 ENCOUNTER — Encounter (HOSPITAL_COMMUNITY): Payer: Self-pay | Admitting: *Deleted

## 2017-05-17 ENCOUNTER — Telehealth (HOSPITAL_COMMUNITY): Payer: Self-pay | Admitting: *Deleted

## 2017-05-17 NOTE — Telephone Encounter (Signed)
Telephoned patient at mobile number and left message to return call to [facility].  

## 2017-06-02 ENCOUNTER — Telehealth (HOSPITAL_COMMUNITY): Payer: Self-pay | Admitting: *Deleted

## 2017-06-02 NOTE — Telephone Encounter (Signed)
Telephoned patient at home number and busy. Telephoned patient's granddaughter and no answer.

## 2017-06-07 ENCOUNTER — Ambulatory Visit: Payer: No Typology Code available for payment source | Admitting: Obstetrics & Gynecology

## 2017-07-27 ENCOUNTER — Encounter (HOSPITAL_COMMUNITY): Payer: Self-pay | Admitting: *Deleted

## 2017-08-02 ENCOUNTER — Ambulatory Visit: Payer: Self-pay | Attending: Nurse Practitioner | Admitting: Nurse Practitioner

## 2017-08-02 ENCOUNTER — Encounter: Payer: Self-pay | Admitting: Nurse Practitioner

## 2017-08-02 VITALS — BP 148/97 | HR 81 | Temp 98.7°F | Ht 59.0 in | Wt 121.6 lb

## 2017-08-02 DIAGNOSIS — G8929 Other chronic pain: Secondary | ICD-10-CM | POA: Insufficient documentation

## 2017-08-02 DIAGNOSIS — R87611 Atypical squamous cells cannot exclude high grade squamous intraepithelial lesion on cytologic smear of cervix (ASC-H): Secondary | ICD-10-CM

## 2017-08-02 DIAGNOSIS — Z1211 Encounter for screening for malignant neoplasm of colon: Secondary | ICD-10-CM

## 2017-08-02 DIAGNOSIS — M25512 Pain in left shoulder: Secondary | ICD-10-CM

## 2017-08-02 DIAGNOSIS — M25511 Pain in right shoulder: Secondary | ICD-10-CM | POA: Insufficient documentation

## 2017-08-02 DIAGNOSIS — I10 Essential (primary) hypertension: Secondary | ICD-10-CM | POA: Insufficient documentation

## 2017-08-02 MED ORDER — HYDROCHLOROTHIAZIDE 25 MG PO TABS: 25.0000 mg | ORAL_TABLET | Freq: Every day | ORAL | 3 refills | Status: DC

## 2017-08-02 MED ORDER — IBUPROFEN 600 MG PO TABS: 600.0000 mg | ORAL_TABLET | Freq: Three times a day (TID) | ORAL | 1 refills | Status: DC | PRN

## 2017-08-02 MED ORDER — VOLTAREN 1 % TD GEL: 2.0000 g | Freq: Four times a day (QID) | TRANSDERMAL | 0 refills | Status: AC

## 2017-08-02 MED FILL — DICLOFENAC SODIUM 1% GEL: 1 | 12 days supply | Qty: 100 | Fill #0

## 2017-08-02 MED FILL — IBUPROFEN 600 MG TABLET: 600 | 20 days supply | Qty: 60 | Fill #0

## 2017-08-02 MED FILL — HYDROCHLOROTHIAZIDE 25 MG T: 25 | 30 days supply | Qty: 30 | Fill #1

## 2017-08-02 NOTE — Progress Notes (Signed)
Assessment & Plan:  Ashley Pena was seen today for shoulder pain.  Diagnoses and all orders for this visit:  Chronic pain of both shoulders -     Ambulatory referral to Physical Medicine Rehab -     ibuprofen (ADVIL,MOTRIN) 600 MG tablet; Take 1 tablet (600 mg total) by mouth every 8 (eight) hours as needed for moderate pain. -  May alternate with heat and ice application for pain relief. May also alternate with acetaminophen as prescribed for pain.    Essential hypertension -     hydrochlorothiazide (HYDRODIURIL) 25 MG tablet; Take 1 tablet (25 mg total) by mouth daily. Continue all antihypertensives as prescribed.  Remember to bring in your blood pressure log with you for your follow up appointment.  DASH/Mediterranean Diets are healthier choices for HTN.   Colon cancer screening -     Ambulatory referral to Gastroenterology  Atypical squamous cells cannot exclude high grade squamous intraepithelial lesion on cytologic smear of cervix (ASC-H) -     Ambulatory referral to Gynecology Daughter states they were not aware that appointment had been made. The previous phone number was not an accurate contact number. It has now been changed in the system. They are aware they will be receiving a call to schedule with GYN for abnormal PAP  Patient has been counseled on age-appropriate routine health concerns for screening and prevention. These are reviewed and up-to-date. Referrals have been placed accordingly. Immunizations are up-to-date or declined.    Subjective:   Chief Complaint  Patient presents with  . Shoulder Pain    Pt. is here for shoulder pain. Pt. have the financial aid assistance.    [name] 62 y.o. female presents to office today for follow up to B/L shoulder pain.   Shoulder Pain Patient complaints of bilateral shoulder pain L>R.  The pain is described as aching, dull and stabbing.  The onset of the pain was over a year ago.  The pain occurs continuously and lasts  several  hours.  Location is acromioclavicular joint. No history of dislocation. Symptoms are aggravated by all activities. Symptoms are diminished by  rest, avoiding the painful activities.   Limited activities include: all activities. moderate stiffness, no weakness, no swelling is reported.     Essential Hypertension Disease Monitoring  Blood pressure range: She does not monitor her blood pressure at home. Blood pressure is not well controlled today despite her endorsement of medication compliance.  BP Readings from Last 3 Encounters:  08/02/17 (!) 148/97  05/05/17 108/64  02/01/17 (!) 143/92    Chest pain: no   Dyspnea: no   Claudication: no  Medication compliance: no  Medication Side Effects  Lightheadedness: no   Urinary frequency: no   Edema: no   Impotence: no  Preventitive Healthcare:  Exercise: no   Diet Pattern: salt not added to cooking  Salt Restriction:  no     Review of Systems  Constitutional: Negative for fever, malaise/fatigue and weight loss.  HENT: Negative.  Negative for nosebleeds.   Eyes: Negative.  Negative for blurred vision, double vision and photophobia.  Respiratory: Negative.  Negative for cough and shortness of breath.   Cardiovascular: Negative.  Negative for chest pain, palpitations and leg swelling.  Gastrointestinal: Negative.  Negative for heartburn, nausea and vomiting.  Musculoskeletal: Positive for joint pain. Negative for myalgias.  Neurological: Negative.  Negative for dizziness, focal weakness, seizures and headaches.  Psychiatric/Behavioral: Negative.  Negative for suicidal ideas.    Past Medical  History:  Diagnosis Date  . Hypertension     Past Surgical History:  Procedure Laterality Date  . CESAREAN SECTION      Family History  Problem Relation Age of Onset  . Hypertension Mother     Social History Reviewed with no changes to be made today.   Outpatient Medications Prior to Visit  Medication Sig Dispense Refill  .  hydrochlorothiazide (HYDRODIURIL) 25 MG tablet Take 1 tablet (25 mg total) by mouth daily. 90 tablet 3  . ibuprofen (ADVIL,MOTRIN) 200 MG tablet Take 200 mg by mouth every 6 (six) hours as needed.     No facility-administered medications prior to visit.     No Known Allergies     Objective:    BP (!) 148/97 (BP Location: Left Arm, Patient Position: Sitting, Cuff Size: Normal)   Pulse 81   Temp 98.7 F (37.1 C) (Oral)   Ht 4\' 11"  (1.499 m)   Wt 121 lb 9.6 oz (55.2 kg)   SpO2 97%   BMI 24.56 kg/m  Wt Readings from Last 3 Encounters:  08/02/17 121 lb 9.6 oz (55.2 kg)  02/01/17 119 lb 9.6 oz (54.3 kg)  12/13/16 118 lb (53.5 kg)    Physical Exam  Constitutional: She is oriented to person, place, and time. She appears well-developed and well-nourished. She is cooperative.  HENT:  Head: Normocephalic and atraumatic.  Eyes: EOM are normal.  Neck: Normal range of motion.  Cardiovascular: Normal rate, regular rhythm, normal heart sounds and intact distal pulses. Exam reveals no gallop and no friction rub.  No murmur heard. Pulmonary/Chest: Effort normal and breath sounds normal. No tachypnea. No respiratory distress. She has no decreased breath sounds. She has no wheezes. She has no rhonchi. She has no rales. She exhibits no tenderness.  Abdominal: Soft. Bowel sounds are normal.  Musculoskeletal: She exhibits tenderness. She exhibits no edema or deformity.       Right shoulder: She exhibits normal range of motion, no tenderness and no pain.       Left shoulder: She exhibits decreased range of motion, tenderness and pain. She exhibits no swelling.  Pain elicited in left shoulder with passive adduction and passive abduction of left arm.   Neurological: She is alert and oriented to person, place, and time. Coordination normal.  Skin: Skin is warm and dry.  Psychiatric: She has a normal mood and affect. Her behavior is normal. Judgment and thought content normal.  Nursing note and vitals  reviewed.      Patient has been counseled extensively about nutrition and exercise as well as the importance of adherence with medications and regular follow-up. The patient was given clear instructions to go to ER or return to medical center if symptoms don't improve, worsen or new problems develop. The patient verbalized understanding.   Follow-up: Return in about 3 weeks (around 08/23/2017) for BP recheck with nurse .   Claiborne Rigg, FNP-BC [facility] [place], [place] [phone]   08/02/2017, 1:39 PM

## 2017-08-12 ENCOUNTER — Encounter: Payer: Self-pay | Admitting: Physical Medicine & Rehabilitation

## 2017-08-23 ENCOUNTER — Encounter: Payer: No Typology Code available for payment source | Admitting: Pharmacist

## 2017-08-25 ENCOUNTER — Ambulatory Visit: Payer: No Typology Code available for payment source | Admitting: Physical Medicine & Rehabilitation

## 2017-09-06 ENCOUNTER — Ambulatory Visit: Payer: No Typology Code available for payment source

## 2017-09-15 ENCOUNTER — Ambulatory Visit: Payer: No Typology Code available for payment source | Admitting: Physical Medicine & Rehabilitation

## 2017-09-23 ENCOUNTER — Telehealth: Payer: Self-pay

## 2017-09-23 NOTE — Telephone Encounter (Signed)
Pt family member reports this is an urgent matter and is hoping to have it by Tuesday. She was notified that most requests take up to two weeks to process and that we would do the best we can.

## 2017-09-25 NOTE — Telephone Encounter (Signed)
I'm sorry. What was the message?

## 2017-09-26 ENCOUNTER — Encounter: Payer: Self-pay | Admitting: Family Medicine

## 2017-09-26 ENCOUNTER — Telehealth: Payer: Self-pay

## 2017-09-26 ENCOUNTER — Ambulatory Visit: Payer: No Typology Code available for payment source | Admitting: Obstetrics and Gynecology

## 2017-09-26 NOTE — Telephone Encounter (Signed)
Patient's grand daughter came in to see if the paper work is ready.  CMA inform her that PCP stated it will be ready by Friday.  Grand daughter wanted to ask if PCP can fill it out by Tuesday.

## 2017-09-26 NOTE — Telephone Encounter (Signed)
Sorry I was helping at the front desk that AM, the patients granddaughter left some paper work that needs to be completed with the front desk and told us it was urgent and she needed the papers back for something on Tuesday. We let her know we may not be able to have them ready since we usually ask for more time to complete things.

## 2017-09-26 NOTE — Telephone Encounter (Signed)
Letter will be ready on Friday

## 2017-09-26 NOTE — Telephone Encounter (Signed)
Called pt to see if appt can be rescheduled. Pts granddaughter answered phone, asked if she can have grandmother to call to reschedule, gave # to call.Granddaughter verbalized understanding.

## 2017-09-27 NOTE — Telephone Encounter (Signed)
Paperwork has been picked up by granddaughter. She was unable to wait for me to fill papers out. I did not sign any documents

## 2017-10-06 ENCOUNTER — Other Ambulatory Visit: Payer: Self-pay

## 2017-10-06 ENCOUNTER — Encounter: Payer: No Typology Code available for payment source | Attending: Physical Medicine & Rehabilitation

## 2017-10-06 ENCOUNTER — Ambulatory Visit (HOSPITAL_BASED_OUTPATIENT_CLINIC_OR_DEPARTMENT_OTHER): Payer: No Typology Code available for payment source | Admitting: Physical Medicine & Rehabilitation

## 2017-10-06 ENCOUNTER — Encounter: Payer: Self-pay | Admitting: Physical Medicine & Rehabilitation

## 2017-10-06 VITALS — BP 126/82 | HR 69 | Ht 59.75 in | Wt 124.6 lb

## 2017-10-06 DIAGNOSIS — M7540 Impingement syndrome of unspecified shoulder: Secondary | ICD-10-CM

## 2017-10-06 DIAGNOSIS — M7542 Impingement syndrome of left shoulder: Secondary | ICD-10-CM | POA: Insufficient documentation

## 2017-10-06 DIAGNOSIS — M25511 Pain in right shoulder: Secondary | ICD-10-CM | POA: Insufficient documentation

## 2017-10-06 DIAGNOSIS — G8929 Other chronic pain: Secondary | ICD-10-CM | POA: Insufficient documentation

## 2017-10-06 DIAGNOSIS — M7918 Myalgia, other site: Secondary | ICD-10-CM

## 2017-10-06 DIAGNOSIS — M25512 Pain in left shoulder: Secondary | ICD-10-CM | POA: Insufficient documentation

## 2017-10-06 DIAGNOSIS — M7541 Impingement syndrome of right shoulder: Secondary | ICD-10-CM | POA: Insufficient documentation

## 2017-10-06 DIAGNOSIS — M542 Cervicalgia: Secondary | ICD-10-CM | POA: Insufficient documentation

## 2017-10-06 NOTE — Patient Instructions (Signed)

## 2017-10-06 NOTE — Progress Notes (Signed)
Subjective:    Patient ID: Ashley Pena, female    DOB: 1955-04-10, 62 y.o.   MRN: [id number]  HPI 62 yo F from [place] with hx Bilateral shoulder pain x 47yr Pain increased with elevation of shoulder  No hx of trauma.  Occ neck pain as well.  Sometimes neck and shoulder pain occur at once.  Symptoms are stable, nonprogressive.  No gait abnormality no bowel or bladder incontinence.  Medicine helps sometime, pt takes both Ibuprofen and uses voltaren gel  Has not tried exercise, she is independent with all her self-care and mobility she has difficulty placing objects overhead. There is no numbness or tingling in the arms.  No weakness in the arms.  Past history no prior surgeries in the neck or shoulder area.  Pain Inventory Average Pain 10 Pain Right Now 10 My pain is sharp and aching  In the last 24 hours, has pain interfered with the following? General activity 7 Relation with others 0 Enjoyment of life 0 What TIME of day is your pain at its worst? daytime and night Sleep (in general) Fair  Pain is worse with: some activites Pain improves with: medication Relief from Meds: 6  Mobility walk without assistance how many minutes can you walk? 60 ability to climb steps?  yes do you drive?  no  Function not employed: date last employed n/a  Neuro/Psych weakness  Prior Studies Any changes since last visit?  no  Physicians involved in your care Any changes since last visit?  no   Family History  Problem Relation Age of Onset  . Hypertension Mother    Social History   Socioeconomic History  . Marital status: Single    Spouse name: Not on file  . Number of children: Not on file  . Years of education: Not on file  . Highest education level: Not on file  Occupational History  . Not on file  Social Needs  . Financial resource strain: Not on file  . Food insecurity:    Worry: Not on file    Inability: Not on file  . Transportation needs:    Medical: Not  on file    Non-medical: Not on file  Tobacco Use  . Smoking status: Never Smoker  . Smokeless tobacco: Never Used  Substance and Sexual Activity  . Alcohol use: No  . Drug use: No  . Sexual activity: Not Currently  Lifestyle  . Physical activity:    Days per week: Not on file    Minutes per session: Not on file  . Stress: Not on file  Relationships  . Social connections:    Talks on phone: Not on file    Gets together: Not on file    Attends religious service: Not on file    Active member of club or organization: Not on file    Attends meetings of clubs or organizations: Not on file    Relationship status: Not on file  Other Topics Concern  . Not on file  Social History Narrative  . Not on file   Past Surgical History:  Procedure Laterality Date  . CESAREAN SECTION     Past Medical History:  Diagnosis Date  . Hypertension    BP 126/82   Pulse 69   Ht 4' 11.75" (1.518 m)   Wt 124 lb 9.6 oz (56.5 kg)   SpO2 97%   BMI 24.54 kg/m   Opioid Risk Score:   Fall Risk Score:  `1  Depression screen PHQ 2/9  Depression screen [facility] 2/9 10/06/2017 08/02/2017 02/01/2017 12/13/2016 04/26/2016 01/21/2016 10/20/2015  Decreased Interest 0 0 0 1 1 0 1  Down, Depressed, Hopeless 0 0 0 0 0 0 1  PHQ - 2 Score 0 0 0 1 1 0 2  Altered sleeping 1 0 0 0 - - 2  Tired, decreased energy 1 0 0 0 - - 0  Change in appetite 0 0 0 1 - - 0  Feeling bad or failure about yourself  0 0 0 0 - - 0  Trouble concentrating 0 0 0 0 - - 0  Moving slowly or fidgety/restless 0 0 0 0 - - 0  Suicidal thoughts 0 0 0 0 - - 0  PHQ-9 Score 2 0 0 2 - - 4  Difficult doing work/chores Somewhat difficult - - - - - -    Review of Systems  Constitutional: Negative.   HENT: Negative.   Eyes: Negative.   Respiratory: Negative.   Cardiovascular: Negative.   Gastrointestinal: Negative.   Endocrine: Negative.   Genitourinary: Negative.   Musculoskeletal: Negative.   Skin: Negative.   Allergic/Immunologic: Negative.     Neurological: Negative.   Hematological: Negative.   Psychiatric/Behavioral: Negative.   All other systems reviewed and are negative.      Objective:   Physical Exam  Constitutional: She is oriented to person, place, and time. She appears well-developed and well-nourished. No distress.  HENT:  Head: Normocephalic and atraumatic.  Eyes: Pupils are equal, round, and reactive to light. EOM are normal.  Neck: Normal range of motion.  Cardiovascular: Normal rate, regular rhythm and normal heart sounds.  No murmur heard. Pulmonary/Chest: Effort normal and breath sounds normal. No respiratory distress.  Abdominal: Soft. Bowel sounds are normal. She exhibits no distension. There is no tenderness.  Musculoskeletal:       Right shoulder: She exhibits decreased range of motion and tenderness. She exhibits no effusion, no crepitus and no deformity.       Cervical back: She exhibits pain. She exhibits normal range of motion.  Pain in post neck with extension   Head forward posture  Tenderness over bilateral upper traps  + impingement signs R> L shoulder  Neurological: She is alert and oriented to person, place, and time. She has normal strength. She displays no atrophy. No sensory deficit. Coordination and gait normal.  Motor is 5/5 in bilateral deltoid biceps triceps grip, Hip flexor knee ext and ankle dorsiflexors  Sensation intact to LT in Bilateral C5,6,7,8 T1 distribution  Skin: She is not diaphoretic.  Nursing note and vitals reviewed.         Assessment & Plan:  1.  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  Pain partially responsive to NSAID but interferes with ADL Rec PT- made referral for upper back strengthening Work on postural issues - head forward posture  Shoulder injections  Shoulder injection Bilateral subacromial without ultrasound guidance  Indication:Bilateral Shoulder pain not relieved by medication  management and other conservative care.  Informed consent was obtained after describing risks and benefits of the procedure with the patient, this includes bleeding, bruising, infection and medication side effects. The patient wishes to proceed and has given written consent. Patient was placed in a seated position. TheRIght shoulder was marked and prepped with betadine in the subacromial area. A 25-gauge 1-1/2 inch needle was inserted into the subacromial area. After negative draw back for blood, a solution containing  1 mL of 6 mg per ML betamethasone and 4 mL of 1% lidocaine was injected. A band aid was applied. The patient tolerated the procedure well. Same procedure performed on the left with same needle and equipment.Post procedure instructions were given.  2.  Cervicalgia- suspect cervcal facet syndrome, PT can address as well.  Consider MBB if PT not helpful

## 2017-10-07 ENCOUNTER — Telehealth: Payer: Self-pay | Admitting: Nurse Practitioner

## 2017-10-07 NOTE — Telephone Encounter (Signed)
Patients granddaughter dropped of paperwork please call her once you have time to fill it out and it is ready at [phone]

## 2017-10-12 MED FILL — HYDROCHLOROTHIAZIDE 25 MG T: 25 | 30 days supply | Qty: 30 | Fill #2

## 2017-10-19 ENCOUNTER — Encounter: Payer: Self-pay | Admitting: Physical Therapy

## 2017-10-19 ENCOUNTER — Other Ambulatory Visit: Payer: Self-pay

## 2017-10-19 ENCOUNTER — Ambulatory Visit: Payer: Self-pay | Attending: Physical Medicine & Rehabilitation | Admitting: Physical Therapy

## 2017-10-19 DIAGNOSIS — M6281 Muscle weakness (generalized): Secondary | ICD-10-CM | POA: Insufficient documentation

## 2017-10-19 DIAGNOSIS — M25511 Pain in right shoulder: Secondary | ICD-10-CM | POA: Insufficient documentation

## 2017-10-19 DIAGNOSIS — M25512 Pain in left shoulder: Secondary | ICD-10-CM | POA: Insufficient documentation

## 2017-10-19 DIAGNOSIS — R293 Abnormal posture: Secondary | ICD-10-CM | POA: Insufficient documentation

## 2017-10-19 DIAGNOSIS — M542 Cervicalgia: Secondary | ICD-10-CM | POA: Insufficient documentation

## 2017-10-19 NOTE — Addendum Note (Signed)
Addended by: Karie Mainland L on: 10/19/2017 02:12 PM   Modules accepted: Orders

## 2017-10-19 NOTE — Therapy (Signed)
[facility] [address] [place], [place], [address] Phone: [phone]   Fax:  [phone]  Physical Therapy Evaluation  Patient Details  Name: Ashley Pena MRN: [id number] Date of Birth: [birth year] Referring Provider (PT): Dr. Claudette Laws   Encounter Date: 10/19/2017  PT End of Session - 10/19/17 1255    Visit Number  1    Number of Visits  12    Date for PT Re-Evaluation  12/02/17    PT Start Time  1016    PT Stop Time  1110    PT Time Calculation (min)  54 min    Activity Tolerance  Patient tolerated treatment well    Behavior During Therapy  [facility] for tasks assessed/performed       Past Medical History:  Diagnosis Date  . Hypertension     Past Surgical History:  Procedure Laterality Date  . CESAREAN SECTION      There were no vitals filed for this visit.   Subjective Assessment - 10/19/17 1023    Subjective  Patient with chronic bilateral shoulder and neck pain.  Pain with overhead lifting, reaching and use of arms for houseworks, ADLs.  She complains of weakness in both UE .  Denies neck stiffness and sensory changes.  She is from [place], has been here in [place] for >10 yrs.  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  None recent.     Patient Stated Goals  Patient would like to be able to have less pain, better strength     Currently in Pain?  Yes    Pain Score  6     Pain Location  Shoulder   both    Pain Orientation  Right;Left;Anterior;Lateral    Pain Descriptors / Indicators  Aching    Pain Type  Chronic pain     Pain Onset  More than a month ago    Pain Frequency  Intermittent    Aggravating Factors   lifting, using arms     Pain Relieving Factors  rest, medicine     Effect of Pain on Daily Activities  limits what she can do in her home          [facility] PT Assessment - 10/19/17 0001      Assessment   Medical Diagnosis  myofascial pain, cervical and shoulder pain    Referring Provider (PT)  Dr. Claudette Laws    Onset Date/Surgical Date  --   chronic    Hand Dominance  Right    Prior Therapy  No      Precautions   Precautions  None      Restrictions   Weight Bearing Restrictions  No      Balance Screen   Has the patient fallen in the past 6 months  No      Home Environment   Living Environment  Private residence    Living Arrangements  Alone    Type of Home  Apartment      Prior Function   Level of  Independence  Independent    Vocation  Unemployed    Leisure  sedentary, family       Cognition   Overall Cognitive Status  Within Functional Limits for tasks assessed      Observation/Other Assessments   Focus on Therapeutic Outcomes (FOTO)   NT due to language barrier       Sensation   Light Touch  Appears Intact      Posture/Postural Control   Posture/Postural Control  Postural limitations    Postural Limitations  Rounded Shoulders;Forward head;Increased thoracic kyphosis      AROM   Right Shoulder Flexion  112 Degrees    Right Shoulder ABduction  80 Degrees    Right Shoulder Internal Rotation  --   FR to Lumbar ,pain    Right Shoulder External Rotation  --   FR to back of neck    Left Shoulder Flexion  100 Degrees    Left Shoulder ABduction  80 Degrees    Left Shoulder Internal Rotation  --   FR lumbar with pain    Left Shoulder External Rotation  --   FR to lateral neck    Cervical Flexion  WFL    Cervical Extension  25% pain L side     Cervical - Right Side Bend  50%     Cervical - Left Side Bend  50%     Cervical - Right Rotation  WFL    Cervical - Left  Rotation  WFL       PROM   Overall PROM Comments  Cervical NT.  Shoulder flexion 120 deg, abduction 90 , ER 60, IR 60 pain all planes       Strength   Right Shoulder Flexion  4/5    Right Shoulder Internal Rotation  4/5    Right Shoulder External Rotation  4/5    Left Shoulder Flexion  3/5    Left Shoulder Internal Rotation  4+/5    Left Shoulder External Rotation  4/5      Palpation   Palpation comment  pain, soreness throughout shoulders ant/lateral/posterior cuff                 Objective measurements completed on examination: See above findings.      [facility] Adult PT Treatment/Exercise - 10/19/17 0001      Shoulder Exercises: Supine   Flexion  AAROM;Strengthening;Both;15 reps      Moist Heat Therapy   Number Minutes Moist Heat  10 Minutes    Moist Heat Location  Shoulder             PT Education - 10/19/17 1254    Education Details  PT/POC, HEP , ice vs heat , impingement, anatomy    Person(s) Educated  Patient    Methods  Explanation;Handout    Comprehension  Verbalized understanding;Returned demonstration          PT Long Term Goals - 10/19/17 1255      PT LONG TERM GOAL #1   Title  Pt will be I with HEP for posture, ROM, strength     Time  6    Period  Weeks    Status  New    Target Date  12/02/17      PT LONG TERM GOAL #2   Title  Patient will be able to fold and hang laundry with 50% less pain, fatigue in arms, shoulders    Time  6    Period  Weeks  Status  New    Target Date  12/02/17      PT LONG TERM GOAL #3   Title  Patient will be able to demonstrate proper posture, lifting techniques to prevent reinjury.     Time  6    Period  Weeks    Status  New    Target Date  12/02/17      PT LONG TERM GOAL #4   Title  Patient will be able to reach behind her back with pain minimal for ADLs    Time  6    Period  Weeks    Status  New    Target Date  12/02/17             Plan - 10/19/17 1357    Clinical Impression  Statement  Patient presents for low complexity evaluation of bilateral shoulder pain which is consistent with impingement syndrome. She demonstrates poor posture, bilateral UE weakness and lack of shoulder, cervical spine ROM.     Clinical Presentation  Stable    Clinical Decision Making  Low    Rehab Potential  Excellent    PT Frequency  2x / week    PT Duration  6 weeks    PT Treatment/Interventions  ADLs/Self Care Home Management;Cryotherapy;Ultrasound;Moist Heat;Iontophoresis 4mg /ml Dexamethasone;Electrical Stimulation;Therapeutic exercise;Therapeutic activities;Functional mobility training;Patient/family education;Manual techniques;Taping;Passive range of motion    PT Next Visit Plan  check HEP, posture, manual to upper traps, cervicals and ROM in supine bilat. UEs     PT Home Exercise Plan  supine cane chest press, overhead , scap retraction, posture     Consulted and Agree with Plan of Care  Patient;Family member/caregiver    Family Member Consulted  granddaughter        Patient will benefit from skilled therapeutic intervention in order to improve the following deficits and impairments:  Decreased mobility, Hypomobility, Improper body mechanics, Decreased range of motion, Decreased strength, Increased fascial restricitons, Impaired flexibility, Impaired UE functional use, Postural dysfunction, Pain  Visit Diagnosis: Bilateral shoulder pain, unspecified chronicity  Muscle weakness (generalized)  Cervicalgia  Abnormal posture     Problem List Patient Active Problem List   Diagnosis Date Noted  . Essential hypertension 01/21/2016    Kamarri Lovvorn 10/19/2017, 2:10 PM  [facility] [address] [place], [place], [address] Phone: [phone]   Fax:  [phone]  Name: Renetta Suman MRN: [id number] Date of Birth: 1955-02-22   Karie Mainland, PT 10/19/17 2:10 PM Phone: [phone] Fax: [phone]

## 2017-10-19 NOTE — Patient Instructions (Signed)
Cane Overhead - Supine  Hold cane at thighs with both hands, extend arms straight over head. Hold ___10 seconds. Repeat _10__ times. Do _2__ times per day.   Chest press x 10   press cane at chest level up toward the ceiling  (bend and straighten elbows)  X 2 per day   Scapular Retraction (Standing)    With arms at sides, pinch shoulder blades together. Repeat __10_ times per set. Do ___2_ sets per session. Do ___2_ sessions per day.  http://orth.exer.us/945     Copyright  VHI. All rights reserved.   Posture - Sitting    Sit upright, head facing forward. Try using a roll to support lower back. Keep shoulders relaxed, and avoid rounded back. Keep hips level with knees. Avoid crossing legs for long periods.   Copyright  VHI. All rights reserved.

## 2017-10-28 ENCOUNTER — Encounter

## 2017-10-31 ENCOUNTER — Ambulatory Visit: Payer: Self-pay | Admitting: Physical Therapy

## 2017-11-04 ENCOUNTER — Ambulatory Visit: Payer: Self-pay | Admitting: Physical Therapy

## 2017-11-07 ENCOUNTER — Ambulatory Visit: Payer: Self-pay | Admitting: Physical Therapy

## 2017-11-09 ENCOUNTER — Telehealth: Payer: Self-pay | Admitting: Physical Therapy

## 2017-11-09 ENCOUNTER — Ambulatory Visit: Payer: Self-pay | Admitting: Physical Therapy

## 2017-11-09 NOTE — Telephone Encounter (Signed)
Patient discharged due to attendance policy, no show x 3. Left message on voicemail.   Karie Mainland, PT 11/09/17 11:15 AM Phone: [phone] Fax: [phone]

## 2017-11-14 ENCOUNTER — Ambulatory Visit: Payer: Self-pay | Admitting: Physical Therapy

## 2017-11-16 ENCOUNTER — Ambulatory Visit: Payer: Self-pay

## 2017-11-16 ENCOUNTER — Ambulatory Visit: Payer: Self-pay | Admitting: Physical Therapy

## 2017-11-17 ENCOUNTER — Encounter: Payer: Self-pay | Attending: Physical Medicine & Rehabilitation

## 2017-11-17 ENCOUNTER — Ambulatory Visit: Payer: Self-pay | Admitting: Physical Medicine & Rehabilitation

## 2017-11-17 DIAGNOSIS — M542 Cervicalgia: Secondary | ICD-10-CM | POA: Insufficient documentation

## 2017-11-17 DIAGNOSIS — M25512 Pain in left shoulder: Secondary | ICD-10-CM | POA: Insufficient documentation

## 2017-11-17 DIAGNOSIS — M25511 Pain in right shoulder: Secondary | ICD-10-CM | POA: Insufficient documentation

## 2017-11-17 DIAGNOSIS — G8929 Other chronic pain: Secondary | ICD-10-CM | POA: Insufficient documentation

## 2017-11-17 DIAGNOSIS — M7542 Impingement syndrome of left shoulder: Secondary | ICD-10-CM | POA: Insufficient documentation

## 2017-11-17 DIAGNOSIS — M7541 Impingement syndrome of right shoulder: Secondary | ICD-10-CM | POA: Insufficient documentation

## 2017-11-21 ENCOUNTER — Encounter: Payer: Self-pay | Admitting: Physical Therapy

## 2017-11-22 ENCOUNTER — Ambulatory Visit: Payer: Self-pay | Attending: Nurse Practitioner | Admitting: Nurse Practitioner

## 2017-11-22 ENCOUNTER — Encounter: Payer: Self-pay | Admitting: Nurse Practitioner

## 2017-11-22 VITALS — BP 129/83 | HR 83 | Temp 98.7°F | Ht 59.0 in | Wt 124.0 lb

## 2017-11-22 DIAGNOSIS — Z79899 Other long term (current) drug therapy: Secondary | ICD-10-CM | POA: Insufficient documentation

## 2017-11-22 DIAGNOSIS — G8929 Other chronic pain: Secondary | ICD-10-CM | POA: Insufficient documentation

## 2017-11-22 DIAGNOSIS — M25511 Pain in right shoulder: Secondary | ICD-10-CM | POA: Insufficient documentation

## 2017-11-22 DIAGNOSIS — I10 Essential (primary) hypertension: Secondary | ICD-10-CM | POA: Insufficient documentation

## 2017-11-22 DIAGNOSIS — M25512 Pain in left shoulder: Secondary | ICD-10-CM | POA: Insufficient documentation

## 2017-11-22 DIAGNOSIS — Z8249 Family history of ischemic heart disease and other diseases of the circulatory system: Secondary | ICD-10-CM | POA: Insufficient documentation

## 2017-11-22 MED ORDER — HYDROCHLOROTHIAZIDE 25 MG PO TABS: 25.0000 mg | ORAL_TABLET | Freq: Every day | ORAL | 3 refills | Status: DC

## 2017-11-22 MED ORDER — DICLOFENAC SODIUM 1 % TD GEL: 4.0000 g | Freq: Four times a day (QID) | TRANSDERMAL | 1 refills | Status: DC

## 2017-11-22 MED ORDER — IBUPROFEN 600 MG PO TABS: 600.0000 mg | ORAL_TABLET | Freq: Three times a day (TID) | ORAL | 1 refills | Status: DC | PRN

## 2017-11-22 MED FILL — HYDROCHLOROTHIAZIDE 25 MG T: 25 | 30 days supply | Qty: 30 | Fill #0

## 2017-11-22 MED FILL — DICLOFENAC SODIUM 1% GEL: 1 | 6 days supply | Qty: 100 | Fill #0

## 2017-11-22 MED FILL — IBUPROFEN 600 MG TABLET: 600 | 20 days supply | Qty: 60 | Fill #0

## 2017-11-22 NOTE — Progress Notes (Signed)
Assessment & Plan:  Ashley Pena was seen today for follow-up and medication refill.  Diagnoses and all orders for this visit:  Essential hypertension -     CMP14+EGFR -     hydrochlorothiazide (HYDRODIURIL) 25 MG tablet; Take 1 tablet (25 mg total) by mouth daily. Continue all antihypertensives as prescribed.  Remember to bring in your blood pressure log with you for your follow up appointment.  DASH/Mediterranean Diets are healthier choices for HTN.    Chronic pain of both shoulders -     diclofenac sodium (VOLTAREN) 1 % GEL; Apply 4 g topically 4 (four) times daily. -     ibuprofen (ADVIL,MOTRIN) 600 MG tablet; Take 1 tablet (600 mg total) by mouth every 8 (eight) hours as needed for moderate pain. May alternate with heat and ice application for pain relief. May also alternate with acetaminophen as prescribed pain relief. Other alternatives include massage, acupuncture and water aerobics.  You must stay active and avoid a sedentary lifestyle.  Patient has been counseled on age-appropriate routine health concerns for screening and prevention. These are reviewed and up-to-date. Referrals have been placed accordingly. Immunizations are up-to-date or declined.    Subjective:   Chief Complaint  Patient presents with  . Follow-up    Pt. is here to follow-up on hypertension.   . Medication Refill   [name] 62 y.o. female presents to office today for follow up to HTN.   CHRONIC HYPERTENSION Disease Monitoring BP Readings from Last 3 Encounters:  11/22/17 129/83  10/06/17 126/82  08/02/17 (!) 148/97  Blood pressure is well controlled today. She endorses medication compliance taking HCTZ 33m daily.   Chest pain: no   Dyspnea: no   Claudication: no  Medication compliance: yes, taking HCTZ 243mdaily.   Medication Side Effects  Lightheadedness: no   Urinary frequency: no   Edema: no   Impotence: no  Preventitive Healthcare:  Exercise: no   Diet Pattern: diet:  general  Salt Restriction:  no   Chronic Shoulder Pain Well controlled with diclofenac gel and sparing use of ibuprofen for severe pain. She is being seen by physical therapy.  Pain is described as stiffness, aching and dull. Location of pain is in the acromioclavicular joint. She denies any injury or trauma to her shoulder. Aggravating factors: All activity.  Review of Systems  Constitutional: Negative for fever, malaise/fatigue and weight loss.  HENT: Negative.  Negative for nosebleeds.   Eyes: Negative.  Negative for blurred vision, double vision and photophobia.  Respiratory: Negative.  Negative for cough and shortness of breath.   Cardiovascular: Negative.  Negative for chest pain, palpitations and leg swelling.  Gastrointestinal: Negative.  Negative for heartburn, nausea and vomiting.  Musculoskeletal: Positive for joint pain (SEE HPI). Negative for myalgias.  Neurological: Negative.  Negative for dizziness, focal weakness, seizures and headaches.  Psychiatric/Behavioral: Negative.  Negative for suicidal ideas.    Past Medical History:  Diagnosis Date  . Hypertension     Past Surgical History:  Procedure Laterality Date  . CESAREAN SECTION      Family History  Problem Relation Age of Onset  . Hypertension Mother     Social History Reviewed with no changes to be made today.   Outpatient Medications Prior to Visit  Medication Sig Dispense Refill  . hydrochlorothiazide (HYDRODIURIL) 25 MG tablet Take 1 tablet (25 mg total) by mouth daily. 90 tablet 3  . ibuprofen (ADVIL,MOTRIN) 600 MG tablet Take 1 tablet (600 mg total) by mouth  every 8 (eight) hours as needed for moderate pain. 60 tablet 1   No facility-administered medications prior to visit.     No Known Allergies     Objective:    BP 129/83 (BP Location: Right Arm, Patient Position: Sitting, Cuff Size: Normal)   Pulse 83   Temp 98.7 F (37.1 C) (Oral)   Ht _0  (1.499 m)   Wt 124 lb (56.2 kg)   SpO2 96%    BMI 25.04 kg/m  Wt Readings from Last 3 Encounters:  11/22/17 124 lb (56.2 kg)  10/06/17 124 lb 9.6 oz (56.5 kg)  08/02/17 121 lb 9.6 oz (55.2 kg)    Physical Exam  Constitutional: She is oriented to person, place, and time. She appears well-developed and well-nourished. She is cooperative.  HENT:  Head: Normocephalic and atraumatic.  Eyes: EOM are normal.  Neck: Normal range of motion.  Cardiovascular: Normal rate, regular rhythm and normal heart sounds. Exam reveals no gallop and no friction rub.  No murmur heard. Pulmonary/Chest: Effort normal and breath sounds normal. No tachypnea. No respiratory distress. She has no decreased breath sounds. She has no wheezes. She has no rhonchi. She has no rales. She exhibits no tenderness.  Abdominal: Bowel sounds are normal.  Musculoskeletal: She exhibits no edema.       Right shoulder: She exhibits decreased range of motion.       Left shoulder: She exhibits decreased range of motion.  Neurological: She is alert and oriented to person, place, and time. Coordination normal.  Skin: Skin is warm and dry.  Psychiatric: She has a normal mood and affect. Her behavior is normal. Judgment and thought content normal.  Nursing note and vitals reviewed.      Patient has been counseled extensively about nutrition and exercise as well as the importance of adherence with medications and regular follow-up. The patient was given clear instructions to go to ER or return to medical center if symptoms don't improve, worsen or new problems develop. The patient verbalized understanding.   Follow-up: Return in about 3 months (around 02/22/2018) for HTN.   Gildardo Pounds, FNP-BC [facility] [place], [place]   11/22/2017, 3:10 PM

## 2017-11-23 ENCOUNTER — Encounter: Payer: Self-pay | Admitting: Physical Therapy

## 2017-11-23 LAB — CMP14+EGFR
A/G RATIO: 1.4 (ref 1.2–2.2)
ALT: 15 IU/L (ref 0–32)
AST: 20 IU/L (ref 0–40)
Albumin: 4.3 g/dL (ref 3.6–4.8)
Alkaline Phosphatase: 61 IU/L (ref 39–117)
BUN/Creatinine Ratio: 12 (ref 12–28)
BUN: 10 mg/dL (ref 8–27)
Bilirubin Total: 0.2 mg/dL (ref 0.0–1.2)
CO2: 25 mmol/L (ref 20–29)
CREATININE: 0.85 mg/dL (ref 0.57–1.00)
Calcium: 9.6 mg/dL (ref 8.7–10.3)
Chloride: 102 mmol/L (ref 96–106)
GFR calc non Af Amer: 73 mL/min/{1.73_m2} (ref >59)
GFR, EST AFRICAN AMERICAN: 84 mL/min/{1.73_m2} (ref >59)
Globulin, Total: 3 g/dL (ref 1.5–4.5)
Glucose: 74 mg/dL (ref 65–99)
POTASSIUM: 3.4 mmol/L — AB (ref 3.5–5.2)
Sodium: 144 mmol/L (ref 134–144)
TOTAL PROTEIN: 7.3 g/dL (ref 6.0–8.5)

## 2017-11-28 ENCOUNTER — Telehealth: Payer: Self-pay | Admitting: Nurse Practitioner

## 2017-11-28 ENCOUNTER — Encounter: Payer: Self-pay | Admitting: Physical Therapy

## 2017-11-28 ENCOUNTER — Telehealth: Payer: Self-pay

## 2017-11-28 NOTE — Telephone Encounter (Signed)
Pt called back and verified DOB, she received note left by her Nurse, she agreed to recheck her potassium level in 6 weeks.

## 2017-11-28 NOTE — Telephone Encounter (Signed)
-----   Message from Claiborne Rigg, NP sent at 11/23/2017  9:11 PM EST ----- Potassium has slightly decreased. Make sure you are eating foods high in potassium such as bananas, yams , raisins, spinach,  Lima beans. Pinto beans. Kidney beans. Almonds or kale. Would like to recheck your potassium in 6 weeks. Please make a lab appointment.

## 2017-11-28 NOTE — Telephone Encounter (Signed)
CMA attempt to reach patient to inform on results.  No answer and left a VM for patient to call back.  If patient call back, please inform:  Potassium has slightly decreased. Make sure you are eating foods high in potassium such as bananas, yams , raisins, spinach,  Lima beans. Pinto beans. Kidney beans. Almonds or kale. Would like to recheck your potassium in 6 weeks. Please make a lab appointment.

## 2017-11-28 NOTE — Telephone Encounter (Signed)
Noted  

## 2017-11-30 ENCOUNTER — Encounter: Payer: Self-pay | Admitting: Physical Therapy

## 2018-01-23 MED FILL — HYDROCHLOROTHIAZIDE 25 MG T: 25 | 30 days supply | Qty: 30 | Fill #1

## 2018-01-23 MED FILL — DICLOFENAC SODIUM 1% GEL: 1 | 6 days supply | Qty: 100 | Fill #1

## 2018-02-02 ENCOUNTER — Ambulatory Visit: Payer: Self-pay

## 2018-02-13 ENCOUNTER — Ambulatory Visit: Payer: Self-pay | Attending: Family Medicine

## 2018-02-14 ENCOUNTER — Encounter (HOSPITAL_COMMUNITY): Payer: Self-pay | Admitting: *Deleted

## 2018-02-14 NOTE — Progress Notes (Signed)
Letter was mailed to patient on 07/27/17 about contacting [facility] about pap smear and needing colposcopy. Patient will now need repeat pap smear.

## 2018-02-22 ENCOUNTER — Ambulatory Visit: Payer: Self-pay | Admitting: Nurse Practitioner

## 2018-02-27 ENCOUNTER — Ambulatory Visit: Payer: Self-pay | Admitting: Nurse Practitioner

## 2018-03-28 ENCOUNTER — Ambulatory Visit: Payer: Self-pay | Attending: Nurse Practitioner | Admitting: Nurse Practitioner

## 2018-03-28 ENCOUNTER — Encounter: Payer: Self-pay | Admitting: Nurse Practitioner

## 2018-03-28 VITALS — BP 138/86 | HR 76 | Temp 99.0°F | Ht 59.0 in | Wt 122.6 lb

## 2018-03-28 DIAGNOSIS — G8929 Other chronic pain: Secondary | ICD-10-CM

## 2018-03-28 DIAGNOSIS — M25511 Pain in right shoulder: Secondary | ICD-10-CM

## 2018-03-28 DIAGNOSIS — R87611 Atypical squamous cells cannot exclude high grade squamous intraepithelial lesion on cytologic smear of cervix (ASC-H): Secondary | ICD-10-CM

## 2018-03-28 DIAGNOSIS — M25512 Pain in left shoulder: Secondary | ICD-10-CM

## 2018-03-28 DIAGNOSIS — Z780 Asymptomatic menopausal state: Secondary | ICD-10-CM

## 2018-03-28 DIAGNOSIS — I10 Essential (primary) hypertension: Secondary | ICD-10-CM

## 2018-03-28 MED ORDER — HYDROCHLOROTHIAZIDE 25 MG PO TABS: 25.0000 mg | ORAL_TABLET | Freq: Every day | ORAL | 3 refills | Status: DC

## 2018-03-28 MED ORDER — DICLOFENAC SODIUM 1 % TD GEL: 4.0000 g | Freq: Four times a day (QID) | TRANSDERMAL | 1 refills | Status: DC

## 2018-03-28 MED FILL — DICLOFENAC SODIUM 1% GEL: 1 | 25 days supply | Qty: 100 | Fill #0

## 2018-03-28 MED FILL — HYDROCHLOROTHIAZIDE 25 MG T: 25 | 30 days supply | Qty: 30 | Fill #0

## 2018-03-28 NOTE — Progress Notes (Signed)
Assessment & Plan:  Ashley Pena was seen today for follow-up.  Diagnoses and all orders for this visit:  Essential hypertension -     hydrochlorothiazide (HYDRODIURIL) 25 MG tablet; Take 1 tablet (25 mg total) by mouth daily. -     CMP14+EGFR -     CBC -     Lipid panel Continue all antihypertensives as prescribed.  Remember to bring in your blood pressure log with you for your follow up appointment.  DASH/Mediterranean Diets are healthier choices for HTN.    Chronic pain of both shoulders -     Ambulatory referral to Physical Medicine Rehab -     diclofenac sodium (VOLTAREN) 1 % GEL; Apply 4 g topically 4 (four) times daily.  Atypical squamous cells cannot exclude high grade squamous intraepithelial lesion on cytologic smear of cervix (ASC-H) -     Ambulatory referral to Gynecology She was a NO SHOW for colposcopy. Will need to be referred back to GYN.   Postmenopausal estrogen deficiency -     DG Bone Density; Future    Patient has been counseled on age-appropriate routine health concerns for screening and prevention. These are reviewed and up-to-date. Referrals have been placed accordingly. Immunizations are up-to-date or declined.    Subjective:   Chief Complaint  Patient presents with  . Follow-up    Pt. is here for HTN F/U. Pt. stated both of her shoulder are still giving her pain.    [name] 63 y.o. female presents to office today for follow up to HTN. She is accompanied by her daughter and grandchildren. Her daughter is interpreting for her today.   ESSENTIAL HYPERTENSION Chronic and well controlled. Taking HCTZ 25 mg daily as prescribed. Denies any chest pain, shortness of breath, palpitations, lightheadedness, dizziness, BLE edema, visual disturbances, or headaches. She does not monitor her blood pressure at home.  BP Readings from Last 3 Encounters:  03/28/18 138/86  11/22/17 129/83  10/06/17 126/82   Bilateral Shoulder Pain Chronic. She stopped going  to physical therapy stating "it hurt too much". Reports only going to 1 or 2 sessions. She continues to endorse pain in both shoulders. 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 She was referred at that time to physical therapy by Dr. Letta Pate. States she would like to see him again and discuss injections as the injection she received last year significantly reduced her pain. She declines PT referral today. Continues to use voltaren gel for relief of pain. She is not performing any home exercises. Denies sciatica. Aggravating factors are lifting heavy objects and raising arms above shoulders.    Review of Systems  Constitutional: Negative for fever, malaise/fatigue and weight loss.  HENT: Negative.  Negative for nosebleeds.   Eyes: Negative.  Negative for blurred vision, double vision and photophobia.  Respiratory: Negative.  Negative for cough and shortness of breath.   Cardiovascular: Negative.  Negative for chest pain, palpitations and leg swelling.  Gastrointestinal: Negative.  Negative for heartburn, nausea and vomiting.  Musculoskeletal: Positive for joint pain, myalgias and neck pain.  Neurological: Negative.  Negative for dizziness, focal weakness, seizures and headaches.  Psychiatric/Behavioral: Negative.  Negative for suicidal ideas.    Past Medical History:  Diagnosis Date  . Hypertension     Past Surgical History:  Procedure Laterality Date  . CESAREAN SECTION      Family History  Problem Relation Age of Onset  . Hypertension Mother     Social History Reviewed with no  changes to be made today.   Outpatient Medications Prior to Visit  Medication Sig Dispense Refill  . ibuprofen (ADVIL,MOTRIN) 600 MG tablet Take 1 tablet (600 mg total) by mouth every 8 (eight) hours as needed for moderate pain. 60 tablet 1  . diclofenac sodium (VOLTAREN) 1 % GEL Apply 4 g topically 4 (four) times daily. 100 g 1  . hydrochlorothiazide (HYDRODIURIL) 25 MG  tablet Take 1 tablet (25 mg total) by mouth daily. 90 tablet 3   No facility-administered medications prior to visit.     No Known Allergies     Objective:    BP 138/86 (BP Location: Right Arm, Patient Position: Sitting, Cuff Size: Normal)   Pulse 76   Temp 99 F (37.2 C) (Oral)   Ht '4\' 11"'  (1.499 m)   Wt 122 lb 9.6 oz (55.6 kg)   SpO2 95%   BMI 24.76 kg/m  Wt Readings from Last 3 Encounters:  03/28/18 122 lb 9.6 oz (55.6 kg)  11/22/17 124 lb (56.2 kg)  10/06/17 124 lb 9.6 oz (56.5 kg)    Physical Exam Vitals signs and nursing note reviewed.  Constitutional:      Appearance: She is well-developed.  HENT:     Head: Normocephalic and atraumatic.  Neck:     Musculoskeletal: Normal range of motion.  Cardiovascular:     Rate and Rhythm: Normal rate and regular rhythm.     Heart sounds: Normal heart sounds. No murmur. No friction rub. No gallop.   Pulmonary:     Effort: Pulmonary effort is normal. No tachypnea or respiratory distress.     Breath sounds: Normal breath sounds. No decreased breath sounds, wheezing, rhonchi or rales.  Chest:     Chest wall: No tenderness.  Abdominal:     General: Bowel sounds are normal.     Palpations: Abdomen is soft.  Musculoskeletal:     Right shoulder: She exhibits decreased range of motion and tenderness. She exhibits no bony tenderness and no swelling.     Left shoulder: She exhibits decreased range of motion and tenderness. She exhibits no bony tenderness and no swelling.  Skin:    General: Skin is warm and dry.  Neurological:     Mental Status: She is alert and oriented to person, place, and time.     Coordination: Coordination normal.  Psychiatric:        Behavior: Behavior normal. Behavior is cooperative.        Thought Content: Thought content normal.        Judgment: Judgment normal.         Patient has been counseled extensively about nutrition and exercise as well as the importance of adherence with medications and  regular follow-up. The patient was given clear instructions to go to ER or return to medical center if symptoms don't improve, worsen or new problems develop. The patient verbalized understanding.   Follow-up: Return in about 3 months (around 06/28/2018) for HTN.   Gildardo Pounds, FNP-BC [facility] and [facility], [place]   03/28/2018, 3:07 PM

## 2018-03-29 LAB — CMP14+EGFR
ALBUMIN: 4.3 g/dL (ref 3.8–4.8)
ALK PHOS: 60 IU/L (ref 39–117)
ALT: 14 IU/L (ref 0–32)
AST: 20 IU/L (ref 0–40)
Albumin/Globulin Ratio: 1.5 (ref 1.2–2.2)
BUN / CREAT RATIO: 10 — AB (ref 12–28)
BUN: 9 mg/dL (ref 8–27)
Bilirubin Total: 0.4 mg/dL (ref 0.0–1.2)
CO2: 21 mmol/L (ref 20–29)
CREATININE: 0.87 mg/dL (ref 0.57–1.00)
Calcium: 9.3 mg/dL (ref 8.7–10.3)
Chloride: 108 mmol/L — ABNORMAL HIGH (ref 96–106)
GFR calc Af Amer: 81 mL/min/{1.73_m2} (ref >59)
GFR calc non Af Amer: 71 mL/min/{1.73_m2} (ref >59)
Globulin, Total: 2.8 g/dL (ref 1.5–4.5)
Glucose: 84 mg/dL (ref 65–99)
Potassium: 3.9 mmol/L (ref 3.5–5.2)
Sodium: 144 mmol/L (ref 134–144)
Total Protein: 7.1 g/dL (ref 6.0–8.5)

## 2018-03-29 LAB — LIPID PANEL
CHOL/HDL RATIO: 3.2 ratio (ref 0.0–4.4)
Cholesterol, Total: 181 mg/dL (ref 100–199)
HDL: 56 mg/dL (ref >39)
LDL Calculated: 109 mg/dL — ABNORMAL HIGH (ref 0–99)
Triglycerides: 81 mg/dL (ref 0–149)
VLDL Cholesterol Cal: 16 mg/dL (ref 5–40)

## 2018-03-29 LAB — CBC
HEMATOCRIT: 34.4 % (ref 34.0–46.6)
Hemoglobin: 11.6 g/dL (ref 11.1–15.9)
MCH: 28.5 pg (ref 26.6–33.0)
MCHC: 33.7 g/dL (ref 31.5–35.7)
MCV: 85 fL (ref 79–97)
Platelets: 329 10*3/uL (ref 150–450)
RBC: 4.07 x10E6/uL (ref 3.77–5.28)
RDW: 13.2 % (ref 11.7–15.4)
WBC: 5.2 10*3/uL (ref 3.4–10.8)

## 2018-04-03 ENCOUNTER — Telehealth: Payer: Self-pay

## 2018-04-03 NOTE — Telephone Encounter (Signed)
-----   Message from Claiborne Rigg, NP sent at 04/03/2018  2:34 PM EDT ----- Your labs do not show any anemia and your kidney and liver function are normal. Cholesterol levels have improved. INSTRUCTIONS: Work on a low fat, heart healthy diet and participate in regular aerobic exercise program by working out at least 150 minutes per week; 5 days a week-30 minutes per day. Avoid red meat, fried foods. junk foods, sodas, sugary drinks, unhealthy snacking, alcohol and smoking.  Drink at least 48oz of water per day and monitor your carbohydrate intake daily.

## 2018-04-03 NOTE — Telephone Encounter (Signed)
CMA spoke to patient's daughter and inform on lab results.

## 2018-07-04 ENCOUNTER — Ambulatory Visit: Payer: Self-pay | Attending: Nurse Practitioner | Admitting: Nurse Practitioner

## 2018-07-04 ENCOUNTER — Other Ambulatory Visit: Payer: Self-pay

## 2018-07-04 ENCOUNTER — Encounter: Payer: Self-pay | Admitting: Nurse Practitioner

## 2018-07-04 DIAGNOSIS — M25511 Pain in right shoulder: Secondary | ICD-10-CM

## 2018-07-04 DIAGNOSIS — M25512 Pain in left shoulder: Secondary | ICD-10-CM

## 2018-07-04 DIAGNOSIS — I10 Essential (primary) hypertension: Secondary | ICD-10-CM

## 2018-07-04 DIAGNOSIS — G8929 Other chronic pain: Secondary | ICD-10-CM

## 2018-07-04 MED ORDER — DICLOFENAC SODIUM 1 % TD GEL: 4.0000 g | Freq: Four times a day (QID) | TRANSDERMAL | 1 refills | Status: DC

## 2018-07-04 MED ORDER — HYDROCHLOROTHIAZIDE 25 MG PO TABS: 25.0000 mg | ORAL_TABLET | Freq: Every day | ORAL | 2 refills | Status: DC

## 2018-07-04 MED FILL — DICLOFENAC SODIUM 1% GEL: 1 | 12 days supply | Qty: 100 | Fill #0

## 2018-07-04 MED FILL — ?HYDROCHLOROTHIAZIDE 25MG T: 25 | 90 days supply | Qty: 90 | Fill #0

## 2018-07-04 NOTE — Progress Notes (Signed)
Virtual Visit via Telephone Note Due to national recommendations of social distancing due to COVID 19, telehealth visit is felt to be most appropriate for this patient at this time.  I discussed the limitations, risks, security and privacy concerns of performing an evaluation and management service by telephone and the availability of in person appointments. I also discussed with the patient that there may be a patient responsible charge related to this service. The patient expressed understanding and agreed to proceed.    I connected with Helane GuntherMarie Snee on 07/04/18  at   8:50 AM EDT  EDT by telephone and verified that I am speaking with the correct person using two identifiers.   Consent I discussed the limitations, risks, security and privacy concerns of performing an evaluation and management service by telephone and the availability of in person appointments. I also discussed with the patient that there may be a patient responsible charge related to this service. The patient expressed understanding and agreed to proceed.   Location of Patient: Private Residence   [facility]    Persons participating in Telemedicine visit: Bertram DenverZelda  FNP-BC YY PotreroBien CMA Helane GuntherMarie Razon    History of Present Illness: Telemedicine visit for:   Essential Hypertension She ran out of her HCTZ 25 mg last month.Doing well overall. Denies chest pain, shortness of breath, palpitations, lightheadedness, dizziness, headaches or BLE edema. She does not monitor her blood pressure at home. Tends to monitor her sodium and fat intake carefully.   BP Readings from Last 3 Encounters:  03/28/18 138/86  11/22/17 129/83  10/06/17 126/82   Arthralgias She has chronic arthritic bilateral shoulder pain which is well controlled with diclofenac gel.     Past Medical History:  Diagnosis Date  . Hypertension     Past Surgical History:  Procedure Laterality Date  .  CESAREAN SECTION      Family History  Problem Relation Age of Onset  . Hypertension Mother     Social History   Socioeconomic History  . Marital status: Single    Spouse name: Not on file  . Number of children: Not on file  . Years of education: Not on file  . Highest education level: Not on file  Occupational History  . Not on file  Social Needs  . Financial resource strain: Not on file  . Food insecurity    Worry: Not on file    Inability: Not on file  . Transportation needs    Medical: Not on file    Non-medical: Not on file  Tobacco Use  . Smoking status: Never Smoker  . Smokeless tobacco: Never Used  Substance and Sexual Activity  . Alcohol use: No  . Drug use: No  . Sexual activity: Not Currently  Lifestyle  . Physical activity    Days per week: Not on file    Minutes per session: Not on file  . Stress: Not on file  Relationships  . Social Musicianconnections    Talks on phone: Not on file    Gets together: Not on file    Attends religious service: Not on file    Active member of club or organization: Not on file    Attends meetings of clubs or organizations: Not on file    Relationship status: Not on file  Other Topics Concern  . Not on file  Social History Narrative  . Not on file     Observations/Objective: Awake, alert and oriented x  3   Review of Systems  Constitutional: Negative for fever, malaise/fatigue and weight loss.  HENT: Negative.  Negative for nosebleeds.   Eyes: Negative.  Negative for blurred vision, double vision and photophobia.  Respiratory: Negative.  Negative for cough and shortness of breath.   Cardiovascular: Negative.  Negative for chest pain, palpitations and leg swelling.  Gastrointestinal: Negative.  Negative for heartburn, nausea and vomiting.  Musculoskeletal: Positive for joint pain. Negative for myalgias.       SEE HPI  Neurological: Negative.  Negative for dizziness, focal weakness, seizures and headaches.   Psychiatric/Behavioral: Negative.  Negative for suicidal ideas.    Assessment and Plan: Alexiss was evaluated today for follow-up.  Diagnoses and all orders for this visit:  Essential hypertension -     hydrochlorothiazide (HYDRODIURIL) 25 MG tablet; Take 1 tablet (25 mg total) by mouth daily. Continue all antihypertensives as prescribed.  Remember to bring in your blood pressure log with you for your follow up appointment.  DASH/Mediterranean Diets are healthier choices for HTN.    Chronic pain of both shoulders -     diclofenac sodium (VOLTAREN) 1 % GEL; Apply 4 g topically 4 (four) times daily.     Follow Up Instructions Return in about 3 months (around 10/04/2018) for HTN.     I discussed the assessment and treatment plan with the patient. The patient was provided an opportunity to ask questions and all were answered. The patient agreed with the plan and demonstrated an understanding of the instructions.   The patient was advised to call back or seek an in-person evaluation if the symptoms worsen or if the condition fails to improve as anticipated.  I provided 17 minutes of non-face-to-face time during this encounter including median intraservice time, reviewing previous notes, labs, imaging, medications and explaining diagnosis and management.  Gildardo Pounds, FNP-BC

## 2018-07-17 ENCOUNTER — Ambulatory Visit: Payer: Self-pay | Admitting: Obstetrics and Gynecology

## 2018-07-17 ENCOUNTER — Encounter: Payer: Self-pay | Admitting: Nurse Practitioner

## 2018-07-20 NOTE — Telephone Encounter (Signed)
Good Morning  Zeld  Patient is not suppose to mention [employer] work . Patient has the [employer] Letter ) that one works in [employer]  .

## 2018-08-01 ENCOUNTER — Other Ambulatory Visit (HOSPITAL_COMMUNITY): Payer: Self-pay | Admitting: *Deleted

## 2018-08-01 DIAGNOSIS — Z1231 Encounter for screening mammogram for malignant neoplasm of breast: Secondary | ICD-10-CM

## 2018-08-03 ENCOUNTER — Other Ambulatory Visit: Payer: Self-pay | Admitting: Nurse Practitioner

## 2018-08-03 NOTE — Progress Notes (Signed)
Hi Ashley Pena,  This patient has an abnormal pap. Can this be worked up along with her mammogram as she is uninsured.

## 2018-10-10 ENCOUNTER — Ambulatory Visit: Payer: Self-pay

## 2018-10-10 ENCOUNTER — Ambulatory Visit (HOSPITAL_COMMUNITY): Payer: Self-pay

## 2018-10-11 ENCOUNTER — Other Ambulatory Visit (HOSPITAL_COMMUNITY): Payer: Self-pay | Admitting: *Deleted

## 2018-10-11 DIAGNOSIS — Z1231 Encounter for screening mammogram for malignant neoplasm of breast: Secondary | ICD-10-CM

## 2018-11-14 ENCOUNTER — Ambulatory Visit
Admission: RE | Admit: 2018-11-14 | Discharge: 2018-11-14 | Disposition: A | Discharge status: Home / Self Care | Payer: No Typology Code available for payment source | Source: Ambulatory Visit | Attending: Obstetrics and Gynecology | Admitting: Obstetrics and Gynecology

## 2018-11-14 ENCOUNTER — Encounter (HOSPITAL_COMMUNITY): Payer: Self-pay

## 2018-11-14 ENCOUNTER — Ambulatory Visit (HOSPITAL_COMMUNITY)
Admission: RE | Admit: 2018-11-14 | Discharge: 2018-11-14 | Disposition: A | Discharge status: Home / Self Care | Payer: Self-pay | Source: Ambulatory Visit | Attending: Obstetrics and Gynecology | Admitting: Obstetrics and Gynecology

## 2018-11-14 ENCOUNTER — Other Ambulatory Visit: Payer: Self-pay

## 2018-11-14 DIAGNOSIS — Z01419 Encounter for gynecological examination (general) (routine) without abnormal findings: Secondary | ICD-10-CM

## 2018-11-14 DIAGNOSIS — Z1231 Encounter for screening mammogram for malignant neoplasm of breast: Secondary | ICD-10-CM

## 2018-11-14 NOTE — Patient Instructions (Signed)
Explained breast self awareness with Fontaine No. Let patient know that if today's Pap smear is normal that her next Pap smear is due in one year. Referred patient to the [facility] for a screening mammogram. Appointment scheduled for Tuesday, November 14, 2018 at 1510. Patient aware of appointment and will be there. Let patient know will follow up with her within the next couple weeks with results with results of Pap smear by letter or phone. Informed patient that the [facility] Center will follow-up with her within the next couple weeks with results of her mammogram by letter or phone. Willadeen Colantuono verbalized understanding.  Ardice Boyan, Arvil Chaco, RN 1:18 PM

## 2018-11-14 NOTE — Progress Notes (Signed)
No complaints today.   Pap Smear: Pap smear completed today. Last Pap smear was 05/05/2017 at [facility] and ASC-H. Patients previous Pap smear 01/21/2016 at [facility] that was normal with positive HPV. Per patient her most recent Pap smear is the only abnormal Pap smear she has had. Last two Pap smear results are in Epic.  Physical exam: Breasts Breasts symmetrical. No skin abnormalities bilateral breasts. No nipple retraction bilateral breasts. No nipple discharge bilateral breasts. No lymphadenopathy. No lumps palpated bilateral breasts. No complaints of pain or tenderness on exam. Referred patient to the [facility] for a screening mammogram. Appointment scheduled for Tuesday, November 14, 2018 at 1510.        Pelvic/Bimanual   Ext Genitalia No lesions, no swelling and no discharge observed on external genitalia.         Vagina Vagina pink and normal texture. No lesions or discharge observed in vagina.          Cervix Cervix is present. Cervix pink and of normal texture. No discharge observed.     Uterus Uterus is present and palpable. Uterus in normal position and normal size.        Adnexae Bilateral ovaries present and palpable. No tenderness on palpation.         Rectovaginal No rectal exam completed today since patient had no rectal complaints. No skin abnormalities observed on exam.    Smoking History: Patient has never smoked.  Patient Navigation: Patient education provided. Access to services provided for patient through [facility] program.   Colorectal Cancer Screening: Per patient has never had a colonoscopy completed. FIT Test completed 05/09/2017 and negative. No complaints today.   Breast and Cervical Cancer Risk Assessment: Patient has no family history of breast cancer, known genetic mutations, or radiation treatment to the chest before age 55. Patient has no history of cervical dysplasia, immunocompromised, or DES  exposure in-utero.  Risk Assessment    Risk Scores      11/14/2018   Last edited by: Loletta Parish, RN   5-year risk: 1.3 %   Lifetime risk: 4.6 %         Patients daughter Chipper Oman signed release to interpret [facility] for patient.

## 2018-11-20 LAB — CYTOLOGY - PAP
Comment: NEGATIVE
Diagnosis: UNDETERMINED — AB
High risk HPV: POSITIVE — AB

## 2018-12-05 MED FILL — DICLOFENAC SODIUM 1% GEL: 1 | 12 days supply | Qty: 100 | Fill #1

## 2018-12-05 MED FILL — ?HYDROCHLOROTHIAZIDE 25MG T: 25 | 90 days supply | Qty: 90 | Fill #1

## 2018-12-12 ENCOUNTER — Telehealth (HOSPITAL_COMMUNITY): Payer: Self-pay | Admitting: *Deleted

## 2018-12-12 NOTE — Telephone Encounter (Signed)
Attempted to call patient to give Pap smear result and follow-up appointment. Patients daughter answered the phone and patient not available. Patients daughter said the patient would be available tomorrow morning. Will call patient back with interpreter tomorrow.

## 2018-12-13 ENCOUNTER — Telehealth (HOSPITAL_COMMUNITY): Payer: Self-pay | Admitting: *Deleted

## 2018-12-13 ENCOUNTER — Telehealth: Payer: Self-pay

## 2018-12-13 NOTE — Telephone Encounter (Signed)
Patient called with her granddaughter Ashley Pena due to patients limited [place]. Explained to patient and granddaughter that her Pap smear was abnormal. Explained the colposcopy the recommended follow-up for her abnormal Pap smear. Gave patient follow-up appointment at the [facility] next Wednesday, December 20, 2018 at 1355. Let patient know that she will need to take her pink BCCCP card to the appointment and that the appointment will be covered by BCCCP. Gave patient the address and directions to appointment. Patient and granddaughter verbalized understanding.

## 2018-12-13 NOTE — Telephone Encounter (Signed)
Patient called wanting pap smear results. Gave patient Altha Harm Brannock's name and number and instructed her to call Altha Harm for those results.

## 2018-12-20 ENCOUNTER — Other Ambulatory Visit (HOSPITAL_COMMUNITY)
Admission: RE | Admit: 2018-12-20 | Discharge: 2018-12-20 | Disposition: A | Discharge status: Home / Self Care | Payer: Medicaid Other | Source: Ambulatory Visit | Attending: Obstetrics and Gynecology | Admitting: Obstetrics and Gynecology

## 2018-12-20 ENCOUNTER — Encounter: Payer: Self-pay | Admitting: Obstetrics and Gynecology

## 2018-12-20 ENCOUNTER — Ambulatory Visit (INDEPENDENT_AMBULATORY_CARE_PROVIDER_SITE_OTHER): Payer: Self-pay | Admitting: Obstetrics and Gynecology

## 2018-12-20 ENCOUNTER — Other Ambulatory Visit: Payer: Self-pay

## 2018-12-20 VITALS — BP 142/90 | HR 68 | Wt 119.1 lb

## 2018-12-20 DIAGNOSIS — N905 Atrophy of vulva: Secondary | ICD-10-CM | POA: Diagnosis not present

## 2018-12-20 DIAGNOSIS — N879 Dysplasia of cervix uteri, unspecified: Secondary | ICD-10-CM | POA: Insufficient documentation

## 2018-12-20 DIAGNOSIS — D069 Carcinoma in situ of cervix, unspecified: Secondary | ICD-10-CM

## 2018-12-20 DIAGNOSIS — N952 Postmenopausal atrophic vaginitis: Secondary | ICD-10-CM | POA: Diagnosis not present

## 2018-12-20 NOTE — Procedures (Signed)
Colposcopy Procedure Note  Pre-operative Diagnosis:  05/2018 colpo: no show 04/2018 pap: ASC-H 01/2016 pap: NILM/HPV + (no subtyping done) 02/2008 pap: NILM  Post-operative Diagnosis: CIN 2   Procedure Details  The risks (including infection, bleeding, pain) and benefits of the procedure were explained to the patient and written informed consent was obtained.  The patient was placed in the dorsal lithotomy position. A Pederson was speculum inserted in the vagina, and the cervix was visualized.  AA staining done Lugol's with green filter.  Biopsy at 10, 1, 5 and 8 o'clock and then single toothed tenaculum applied and ECC in all four quadrants done. No bleeding after procedure  Findings: on AA staining and confirmed with Lugols, the posterior lip, from 7 to 4 o'clock is AWE staining with increased vascularity); similar findings (but not as vascular) was seen as a lip of AWE changes from 12 to 1:30.  Care was taken to get biopsies from 1 and 5 which were the obvious dysplastic areas. 10 and and 8 didn't appear overtly abnormal  Adequate: Yes  Specimens: 10, 1, 5, and 8 o'clock cervical biopsies and ECC  Condition: Stable  Complications: None  Plan: The patient was advised to call for any fever or for prolonged or severe pain or bleeding. She was advised to use OTC analgesics as needed for mild to moderate pain. She was advised to avoid vaginal intercourse for 48 hours or until the bleeding has completely stopped.   Durene Romans MD Attending [facility] for [employer] Fish farm manager)

## 2018-12-22 LAB — SURGICAL PATHOLOGY

## 2018-12-25 ENCOUNTER — Telehealth: Payer: Self-pay | Admitting: Obstetrics and Gynecology

## 2018-12-25 DIAGNOSIS — N879 Dysplasia of cervix uteri, unspecified: Secondary | ICD-10-CM

## 2018-12-25 NOTE — Telephone Encounter (Signed)
GYN Telephone Note Relative "Conception Oms" who was with patient last week called at [phone] and d/w her re: cin2-3 and need for in office leep. Office to contact her and patient to set this up for sometime in the next month.  All questions asked and answered.  Durene Romans MD Attending [facility] for [employer] (Faculty Practice) 12/25/2018 Time: [phone]

## 2018-12-26 ENCOUNTER — Telehealth: Payer: Self-pay | Admitting: Obstetrics and Gynecology

## 2018-12-26 NOTE — Telephone Encounter (Signed)
Attempted to contact patient w/ her LEEP appointment. No answer, left voicemail with the appointment information. Patient instructed to give the office a call back if needing to rescheduled. Reminder mailed

## 2019-01-01 ENCOUNTER — Telehealth: Payer: Self-pay

## 2019-01-01 NOTE — Telephone Encounter (Signed)
Spoke with patient's granddaughter to see if the patient is a citizen. Patient has recently filled paperwork for citizenship. Informed the granddaughter is she is not a citizen she will not be eligible for [facility] and that she will need to fill out financial assistance paperwork with the clinics when she goes for her LEEP on 01/17/19. The granddaughter voiced understanding.

## 2019-01-10 ENCOUNTER — Telehealth (HOSPITAL_COMMUNITY): Payer: Self-pay

## 2019-01-10 NOTE — Telephone Encounter (Signed)
Spoke with patient's granddaughter about completing [employer] medicaid paperwork for patient. Patient's granddaughter confirmed that the patient does have residency. Informed her that she would be eligible for [facility] Medicaid. Will meet her next week before her appointment at [facility] to complete paperwork.

## 2019-01-17 ENCOUNTER — Other Ambulatory Visit: Payer: Self-pay

## 2019-01-17 ENCOUNTER — Encounter: Payer: Self-pay | Admitting: Obstetrics and Gynecology

## 2019-01-17 ENCOUNTER — Ambulatory Visit: Payer: Self-pay | Admitting: Obstetrics and Gynecology

## 2019-01-17 ENCOUNTER — Other Ambulatory Visit (HOSPITAL_COMMUNITY)
Admission: RE | Admit: 2019-01-17 | Discharge: 2019-01-17 | Disposition: A | Discharge status: Home / Self Care | Payer: Medicaid Other | Source: Ambulatory Visit | Attending: Obstetrics and Gynecology | Admitting: Obstetrics and Gynecology

## 2019-01-17 VITALS — BP 123/84 | HR 102 | Wt 117.2 lb

## 2019-01-17 DIAGNOSIS — N871 Moderate cervical dysplasia: Secondary | ICD-10-CM | POA: Diagnosis not present

## 2019-01-17 DIAGNOSIS — N870 Mild cervical dysplasia: Secondary | ICD-10-CM | POA: Diagnosis not present

## 2019-01-17 DIAGNOSIS — N879 Dysplasia of cervix uteri, unspecified: Secondary | ICD-10-CM | POA: Diagnosis not present

## 2019-01-17 HISTORY — PX: LEEP: PRO1013

## 2019-01-17 NOTE — Procedures (Signed)
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  The patient was placed in the dorsal lithotomy position. A coated Graves was speculum inserted in the vagina, and the cervix was visualized.  A colposcopy was performed with acetic acid and lugol's staining, with the below noted findings. The cervical stromal bed was injected with 53mL of 1% lidocaine with epinephrine. Entering at 12 o'clock on the cervix and using a medium Fischer Loop and a setting of 50/50 blend current, a LEEP was done, in one circumferential fashion.  Next, an ECC was done and hemostasis achieved with the ball electrode on 50 coagulation current and application of Monsel's. On coagulation, the entire LEEP bed and surgical margins were thoroughly cauterized  Findings: well healing prior biopsy sites  Adequate: Yes  Specimens: LEEP specimen (circumferential, 360 degrees) and ECC   Condition: Stable  Complications: None  Plan: The patient was advised to call for any fever or for prolonged or severe pain or bleeding. She was advised to use OTC analgesics as needed for mild to moderate pain. Pelvic rest was advised until after her four week post operative visit.   Daughter used to interpret   Aletha Halim, Brooke Bonito MD Attending Center for [employer] Fish farm manager)

## 2019-01-22 LAB — SURGICAL PATHOLOGY

## 2019-01-29 ENCOUNTER — Telehealth: Payer: Self-pay | Admitting: Obstetrics and Gynecology

## 2019-01-29 NOTE — Telephone Encounter (Signed)
GYN Telephone Note [phone] called and VM left to call back re: patient's pathology results.  BCCCP contacted to see if hysterectomy is covered  Cornelia Copa MD Attending Center for [employer] (Faculty Practice) 01/29/2019 Time: 1044am

## 2019-02-13 ENCOUNTER — Encounter: Payer: Self-pay | Admitting: General Practice

## 2019-02-20 ENCOUNTER — Encounter (HOSPITAL_COMMUNITY): Payer: Self-pay

## 2019-05-09 ENCOUNTER — Encounter: Payer: Self-pay | Admitting: Obstetrics and Gynecology

## 2019-05-09 ENCOUNTER — Ambulatory Visit (INDEPENDENT_AMBULATORY_CARE_PROVIDER_SITE_OTHER): Payer: Medicaid Other | Admitting: Obstetrics and Gynecology

## 2019-05-09 ENCOUNTER — Other Ambulatory Visit: Payer: Self-pay

## 2019-05-09 VITALS — BP 140/84 | HR 79 | Wt 118.3 lb

## 2019-05-09 DIAGNOSIS — N879 Dysplasia of cervix uteri, unspecified: Secondary | ICD-10-CM

## 2019-05-09 NOTE — Progress Notes (Addendum)
Obstetrics and Gynecology Visit Return Patient Evaluation  Appointment Date: 05/09/2019  Primary Care Provider: Claiborne Rigg  OBGYN Clinic: [facility] for [facility]  Chief Complaint: follow up LEEP  History of Present Illness:  Melessa Cowell is a 64 y.o. 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 LEEP had cin 2-3 at the margins and on ecc. It has been difficult to get her scheduled for follow up   Pt not having any issues or problems   Review of Systems:  as noted in the History of Present Illness.   Patient Active Problem List   Diagnosis Date Noted  . Vaginal atrophy 12/20/2018  . 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 Cervical dysplasia 12/20/2018  . Well woman exam with routine gynecological exam 11/14/2018  . Essential hypertension 01/21/2016   Medications:  Devetta Hagenow had no medications administered during this visit. Current Outpatient Medications  Medication Sig Dispense Refill  . hydrochlorothiazide (HYDRODIURIL) 25 MG tablet Take 1 tablet (25 mg total) by mouth daily. 90 tablet 2  . acetaminophen (TYLENOL) 325 MG tablet Take 650 mg by mouth every 6 (six) hours as needed.    . diclofenac sodium (VOLTAREN) 1 % GEL Apply 4 g topically 4 (four) times daily. (Patient not taking: Reported on 05/09/2019) 100 g 1  . ibuprofen (ADVIL,MOTRIN) 600 MG tablet Take 1 tablet (600 mg total) by mouth every 8 (eight) hours as needed for moderate pain. (Patient not taking: Reported on 12/20/2018) 60 tablet 1  . MELATONIN PO Take by mouth at bedtime as needed.     No current facility-administered medications for this visit.    Allergies: has No Known Allergies.  Physical Exam:  BP 140/84   Pulse 79   Wt 118 lb 4.8 oz (53.7 kg)   BMI 23.89 kg/m  Body mass index is 23.89 kg/m. General appearance: Well nourished, well developed female in no acute distress.  Abdomen: diffusely non tender to palpation, non distended, and no masses, hernias Neuro/Psych:  Normal mood and affect.     Pelvic exam:  EGBUS: moderate to severe atrophy Vagina: normal Cervix: near flush with the vaginal wall. External os seen   Assessment: pt healing well  Plan: I told her she can do rpt colpo and ecc in two months, rpt leep or hysterectomy. I told her I recommend the latter b/c I feel that the colpo will still be + and there is not any cervix left to LEEP. Pt and grand-daughter amenable to plan  I still haven't heard from [facility] re: getting coverage for hysterectomy but in basket message sent again  Interpreter used  RTC: post op  Cornelia Copa MD Attending Center for [facility])

## 2019-05-10 MED FILL — HYDROCHLOROTHIAZIDE 25 MG T: 25 | 90 days supply | Qty: 90 | Fill #2

## 2019-05-15 ENCOUNTER — Telehealth: Payer: Self-pay

## 2019-05-15 NOTE — Telephone Encounter (Signed)
Ashley Bing, MD  [facility] Clinical Pool  The grand-daughter interprets and the patient wants her to do it   Can you call her and let her know that her [facility] medicaid is approved for her hysterectomy but the medicaid ends in November. The patient will need to come by and sign her medicaid hyserectomy form and I will get her scheduled for her hysterectomy. Thanks!   Called pt and spoke with pt's granddaughter.  I informed her that the pt's [facility] medicaid is approved until November and that she had a discussion with Dr. Vergie Living about having surgery.  I requested for pt to come the office to sign a hysterectomy statement so that she will be able to get the surgery scheduled .  Pt's granddaughter stated that she will call me back to let me know when she will be able to bring her mother.  I advised her that she can call the nurse call back line and just indicate when she will be coming by.  I informed her our current business hours with closing and that if she needs to come next week then we would be in the new space.  She verbalized understanding.

## 2019-05-22 NOTE — Telephone Encounter (Signed)
Called and L/M for pt's granddaughter to please give [place] a call back or send a MyChart about when she will be able to come to the office to sign hysterectomy statement.    Addison Naegeli, RN

## 2019-05-28 ENCOUNTER — Telehealth: Payer: Self-pay

## 2019-05-28 NOTE — Telephone Encounter (Signed)
Called pt to advise of Hysterectomy Consent needing to be signed, no answer, left VM.

## 2019-06-05 ENCOUNTER — Telehealth: Payer: Self-pay

## 2019-06-05 NOTE — Telephone Encounter (Signed)
Pt's daughter called stating her mom does not understand why she's needing this procedure. Advised her that Pt will need to come in & sign a POI form to release detail info regarding mom's Procedure. Pt still has not come in to sign form nor the Hysterectomy statement.

## 2019-06-07 ENCOUNTER — Telehealth: Payer: Self-pay | Admitting: Obstetrics and Gynecology

## 2019-06-07 NOTE — Telephone Encounter (Signed)
Attempted to contact patient with her upcoming appointment information (09/11/19 @ 8:55). No answer, left voicemail with appointment information. Patient instructed to give the office a call with questions or needing to reschedule

## 2019-06-08 ENCOUNTER — Telehealth: Payer: Self-pay | Admitting: Obstetrics and Gynecology

## 2019-06-08 NOTE — Telephone Encounter (Signed)
Patient's daughter called the office stating that she received a call about her mother's appointment on 5/24 @ 8:55. Daughter was instructed it was to have an in person visit with the doctor before her mother has surgery to answer any questions or concerns. Daughter stated that she wants to be in town with her with her mother has the surgery so she can take care of her afterwards. Daughter stated that they would like to schedule the surgery for sometime in July so this could happen. I instructed the daughter that I would have the person that does the surgery scheduling to contact her. Daughter verbalized understanding and message sent to [place] to have the surgery rescheduled.  Appointment on 5/24 @ 8:55 has been canceled per request.

## 2019-06-11 ENCOUNTER — Ambulatory Visit: Payer: Medicaid Other | Admitting: Obstetrics and Gynecology

## 2019-07-03 ENCOUNTER — Ambulatory Visit: Admit: 2019-07-03 | Payer: Medicaid Other | Admitting: Obstetrics and Gynecology

## 2019-07-03 SURGERY — HYSTERECTOMY, TOTAL, LAPAROSCOPIC, WITH BILATERAL SALPINGO-OOPHORECTOMY
Anesthesia: Choice

## 2019-07-30 ENCOUNTER — Telehealth (INDEPENDENT_AMBULATORY_CARE_PROVIDER_SITE_OTHER): Payer: Medicaid Other | Admitting: Obstetrics and Gynecology

## 2019-07-30 DIAGNOSIS — Z7189 Other specified counseling: Secondary | ICD-10-CM

## 2019-07-30 NOTE — Telephone Encounter (Signed)
The daughter is requesting a call back from Dr. Vergie Living. She stated she has called several times, and has yet to receive a call from the doctor. She is very upset that her mother is suppose to be getting surgery, and her mother has no idea what the surgery is for. She stated her mother doesn't understand, and she just wants to get clarity. She understands the doctor is very busy, but she wants to speak to him and ask just a few questions.

## 2019-07-31 NOTE — Telephone Encounter (Signed)
Please let her know that her 6/15 hysterectomy was cancelled because the patient was to meet with me before that surgery to go over questions, etc but that appointment was cancelled by the patient. The patient has an appointment with me tomorrow. She is not scheduled to have surgery until she meets with me again.

## 2019-07-31 NOTE — Telephone Encounter (Signed)
Called patient's mother and discussed Dr Vergie Living message. She verbalized understanding & states she wants to know if the surgery can be rescheduled in July because she will have time off of work then. Told her I wasn't sure and that Dr Vergie Living wants to see her mother for an appt first before surgery will be rescheduled. She states her mom has an appt tomorrow but she will not be there. Recommended her mother call her during the appt and put the call on speaker so she can listen in & ask questions regarding the surgery and scheduling. She verbalized understanding & had no other questions.

## 2019-08-01 ENCOUNTER — Encounter: Payer: Self-pay | Admitting: Obstetrics and Gynecology

## 2019-08-01 ENCOUNTER — Ambulatory Visit (INDEPENDENT_AMBULATORY_CARE_PROVIDER_SITE_OTHER): Payer: Medicare Other | Admitting: Obstetrics and Gynecology

## 2019-08-01 ENCOUNTER — Other Ambulatory Visit: Payer: Self-pay

## 2019-08-01 VITALS — BP 141/89 | HR 98

## 2019-08-01 DIAGNOSIS — N879 Dysplasia of cervix uteri, unspecified: Secondary | ICD-10-CM | POA: Diagnosis not present

## 2019-08-02 NOTE — Progress Notes (Signed)
Obstetrics and Gynecology Visit Return Patient Evaluation  Appointment Date: 08/01/2019  Primary Care Provider: Claiborne Rigg  OBGYN Clinic: [facility]  Chief Complaint: daughter has questions re: surgery  History of Present Illness:  Ashley Pena is a 64 y.o. with above CC. 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 On follow up exam, residual cervix near flush with the vaginal wall. Because of this, I told her I recommend a hyst b/c of difficulty with repeat LEEP, which she is amenable to. We got her approved for medicaid and had the surgery scheduled but was cancelled b/c the daughter had questions re: the surgery.   Review of Systems: as noted in the History of Present Illness.  Medications:  Ashley Pena had no medications administered during this visit. Current Outpatient Medications  Medication Sig Dispense Refill  . acetaminophen (TYLENOL) 325 MG tablet Take 650 mg by mouth every 6 (six) hours as needed.    . diclofenac sodium (VOLTAREN) 1 % GEL Apply 4 g topically 4 (four) times daily. (Patient not taking: Reported on 05/09/2019) 100 g 1  . hydrochlorothiazide (HYDRODIURIL) 25 MG tablet Take 1 tablet (25 mg total) by mouth daily. 90 tablet 2  . ibuprofen (ADVIL,MOTRIN) 600 MG tablet Take 1 tablet (600 mg total) by mouth every 8 (eight) hours as needed for moderate pain. (Patient not taking: Reported on 12/20/2018) 60 tablet 1  . MELATONIN PO Take by mouth at bedtime as needed.     No current facility-administered medications for this visit.    Allergies: has No Known Allergies.  Physical Exam:  BP (!) 141/89   Pulse 98  There is no height or weight on file to calculate BMI. General appearance: Well nourished, well developed female in no acute distress.  Neuro/Psych:  Normal mood and affect.     Assessment: pt doing well  Plan: Rationale for surgery again d/w pt and now her daughter and they are amenable with  proceeding Request sent with dates the daughter will be in town for. I told her that plan is for TLH/BSO and if pt does well then can do same day discharge.   RTC: post op  Cornelia Copa MD Attending Center for [facility])

## 2019-08-03 ENCOUNTER — Ambulatory Visit: Payer: Medicaid Other | Admitting: Nurse Practitioner

## 2019-08-07 ENCOUNTER — Other Ambulatory Visit: Payer: Self-pay | Admitting: Obstetrics and Gynecology

## 2019-08-07 ENCOUNTER — Telehealth: Payer: Self-pay

## 2019-08-07 NOTE — Telephone Encounter (Signed)
Called pt and spoke with pt's daughter and let your know that the patient needs to come in to a sign a form or her surgery will get cancelled.  Pt's daughter called and confirmed that her niece will bring the pt on 08/09/19 btwn 1-2 to sign the form.  Form at the front office ready for pt to sign.     Addison Naegeli, RN

## 2019-08-09 ENCOUNTER — Telehealth: Payer: Self-pay

## 2019-08-09 NOTE — Telephone Encounter (Signed)
-----   Message from York Ram sent at 08/06/2019  2:34 PM EDT ----- Regarding: FW: NEED HYSTERECOTMY STATMENT ASAP - SURGERY 08/04  ----- Message ----- From: Olevia Bowens Sent: 08/06/2019   2:01 PM EDT To: [facility] Admin Pool Subject: NEED HYSTERECOTMY STATMENT ASAP - SURGERY 08#

## 2019-08-09 NOTE — Telephone Encounter (Signed)
Pt came in today & signed the Hysterectomy Statement Form.

## 2019-08-15 ENCOUNTER — Other Ambulatory Visit: Payer: Self-pay | Admitting: Nurse Practitioner

## 2019-08-15 DIAGNOSIS — I10 Essential (primary) hypertension: Secondary | ICD-10-CM

## 2019-08-21 NOTE — Progress Notes (Signed)
Your procedure is scheduled on Tuesday August 10.  Report to [facility] Main Entrance "A" at 10:30 A.M., and check in at the Admitting office.  Call this number if you have problems the morning of surgery: [phone]  Call [phone] if you have any questions prior to your surgery date Monday-Friday 8am-4pm   Remember: Do not eat after midnight the night before your surgery  You may drink clear liquids until 09:30 A.M. the morning of your surgery.   Clear liquids allowed are: Water, Non-Citrus Juices (without pulp), Carbonated Beverages, Clear Tea, Black Coffee Only, and Gatorade   Take these medicines the morning of surgery with A SIP OF WATER:  If needed:  Tylenol    As of today, STOP taking any Aspirin (unless otherwise instructed by your surgeon), diclofenac sodium (VOLTAREN) gel, Aleve, Naproxen, Ibuprofen, Motrin, Advil, Goody's, BC's, all herbal medications, fish oil, and all vitamins.    The Morning of Surgery  Do not wear jewelry, make-up or nail polish.  Do not wear lotions, powders, or perfumes, or deodorant  Do not shave 48 hours prior to surgery.    Do not bring valuables to the hospital.  [facility] is not responsible for any belongings or valuables.  If you are a smoker, DO NOT Smoke 24 hours prior to surgery  If you wear a CPAP at night please bring your mask the morning of surgery   Remember that you must have someone to transport you home after your surgery, and remain with you for 24 hours if you are discharged the same day.   Please bring cases for contacts, glasses, hearing aids, dentures or bridgework because it cannot be worn into surgery.    Leave your suitcase in the car.  After surgery it may be brought to your room.  For patients admitted to the hospital, discharge time will be determined by your treatment team.  Patients discharged the day of surgery will not be allowed to drive home.    Special instructions:   [place]-  Preparing For Surgery  Before surgery, you can play an important role. Because skin is not sterile, your skin needs to be as free of germs as possible. You can reduce the number of germs on your skin by washing with CHG (chlorahexidine gluconate) Soap before surgery.  CHG is an antiseptic cleaner which kills germs and bonds with the skin to continue killing germs even after washing.    Oral Hygiene is also important to reduce your risk of infection.  Remember - BRUSH YOUR TEETH THE MORNING OF SURGERY WITH YOUR REGULAR TOOTHPASTE  Please do not use if you have an allergy to CHG or antibacterial soaps. If your skin becomes reddened/irritated stop using the CHG.  Do not shave (including legs and underarms) for at least 48 hours prior to first CHG shower. It is OK to shave your face.  Please follow these instructions carefully.   1. Shower the NIGHT BEFORE SURGERY and the MORNING OF SURGERY with CHG Soap.   2. If you chose to wash your hair and body, wash as usual with your normal shampoo and body-wash/soap.  3. Rinse your hair and body thoroughly to remove the shampoo and soap.  4. Apply CHG directly to the skin (ONLY FROM THE NECK DOWN) and wash gently with a scrungie or a clean washcloth.   5. Do not use on open wounds or open sores. Avoid contact with your eyes, ears, mouth and genitals (private parts). Wash Face  and genitals (private parts)  with your normal soap.   6. Wash thoroughly, paying special attention to the area where your surgery will be performed.  7. Thoroughly rinse your body with warm water from the neck down.  8. DO NOT shower/wash with your normal soap after using and rinsing off the CHG Soap.  9. Pat yourself dry with a CLEAN TOWEL.  10. Wear CLEAN PAJAMAS to bed the night before surgery  11. Place CLEAN SHEETS on your bed the night of your first shower and DO NOT SLEEP WITH PETS.  12. Wear comfortable clothes the morning of surgery.     Day of  Surgery:  Please shower the morning of surgery with the CHG soap Do not apply any deodorants/lotions. Please wear clean clothes to the hospital/surgery center.   Remember to brush your teeth WITH YOUR REGULAR TOOTHPASTE.   Please read over the following fact sheets that you were given.

## 2019-08-22 ENCOUNTER — Inpatient Hospital Stay (HOSPITAL_COMMUNITY)
Admission: RE | Admit: 2019-08-22 | Discharge: 2019-08-22 | Disposition: A | Discharge status: Home / Self Care | Payer: Medicaid Other | Source: Ambulatory Visit

## 2019-08-24 ENCOUNTER — Encounter (HOSPITAL_COMMUNITY): Payer: Self-pay

## 2019-08-24 ENCOUNTER — Ambulatory Visit (HOSPITAL_COMMUNITY)
Admission: RE | Admit: 2019-08-24 | Discharge: 2019-08-24 | Disposition: A | Discharge status: Home / Self Care | Payer: Medicaid Other | Source: Ambulatory Visit | Attending: Obstetrics and Gynecology | Admitting: Obstetrics and Gynecology

## 2019-08-24 ENCOUNTER — Encounter (HOSPITAL_COMMUNITY)
Admission: RE | Admit: 2019-08-24 | Discharge: 2019-08-24 | Disposition: A | Discharge status: Home / Self Care | Payer: Medicaid Other | Source: Ambulatory Visit | Attending: Obstetrics and Gynecology | Admitting: Obstetrics and Gynecology

## 2019-08-24 ENCOUNTER — Other Ambulatory Visit (HOSPITAL_COMMUNITY)
Admission: RE | Admit: 2019-08-24 | Discharge: 2019-08-24 | Disposition: A | Discharge status: Home / Self Care | Payer: Medicaid Other | Source: Ambulatory Visit | Attending: Obstetrics and Gynecology | Admitting: Obstetrics and Gynecology

## 2019-08-24 ENCOUNTER — Other Ambulatory Visit: Payer: Self-pay

## 2019-08-24 DIAGNOSIS — Z7901 Long term (current) use of anticoagulants: Secondary | ICD-10-CM | POA: Insufficient documentation

## 2019-08-24 DIAGNOSIS — N871 Moderate cervical dysplasia: Secondary | ICD-10-CM | POA: Diagnosis not present

## 2019-08-24 DIAGNOSIS — Z20822 Contact with and (suspected) exposure to covid-19: Secondary | ICD-10-CM | POA: Diagnosis not present

## 2019-08-24 DIAGNOSIS — Z01818 Encounter for other preprocedural examination: Secondary | ICD-10-CM | POA: Insufficient documentation

## 2019-08-24 DIAGNOSIS — I700 Atherosclerosis of aorta: Secondary | ICD-10-CM | POA: Diagnosis not present

## 2019-08-24 DIAGNOSIS — Z79899 Other long term (current) drug therapy: Secondary | ICD-10-CM | POA: Diagnosis not present

## 2019-08-24 DIAGNOSIS — I10 Essential (primary) hypertension: Secondary | ICD-10-CM | POA: Diagnosis not present

## 2019-08-24 LAB — CBC
HCT: 36.3 % (ref 36.0–46.0)
Hemoglobin: 11.6 g/dL — ABNORMAL LOW (ref 12.0–15.0)
MCH: 27.3 pg (ref 26.0–34.0)
MCHC: 32 g/dL (ref 30.0–36.0)
MCV: 85.4 fL (ref 80.0–100.0)
Platelets: 350 10*3/uL (ref 150–400)
RBC: 4.25 MIL/uL (ref 3.87–5.11)
RDW: 14.9 % (ref 11.5–15.5)
WBC: 4.4 10*3/uL (ref 4.0–10.5)
nRBC: 0 % (ref 0.0–0.2)

## 2019-08-24 LAB — COMPREHENSIVE METABOLIC PANEL
ALT: 15 U/L (ref 0–44)
AST: 19 U/L (ref 15–41)
Albumin: 3.9 g/dL (ref 3.5–5.0)
Alkaline Phosphatase: 58 U/L (ref 38–126)
Anion gap: 11 (ref 5–15)
BUN: 8 mg/dL (ref 8–23)
CO2: 21 mmol/L — ABNORMAL LOW (ref 22–32)
Calcium: 9.3 mg/dL (ref 8.9–10.3)
Chloride: 109 mmol/L (ref 98–111)
Creatinine, Ser: 0.75 mg/dL (ref 0.44–1.00)
GFR calc Af Amer: >60 mL/min (ref >60)
GFR calc non Af Amer: >60 mL/min (ref >60)
Glucose, Bld: 89 mg/dL (ref 70–99)
Potassium: 3.6 mmol/L (ref 3.5–5.1)
Sodium: 141 mmol/L (ref 135–145)
Total Bilirubin: 1.1 mg/dL (ref 0.3–1.2)
Total Protein: 7.4 g/dL (ref 6.5–8.1)

## 2019-08-24 LAB — SARS CORONAVIRUS 2 (TAT 6-24 HRS): SARS Coronavirus 2: NEGATIVE

## 2019-08-24 LAB — TYPE AND SCREEN
ABO/RH(D): B POS
Antibody Screen: NEGATIVE

## 2019-08-24 NOTE — Progress Notes (Signed)
PCP - Goes to [facility]   Cardiologist - Denies  Chest x-ray - 08/24/19 EKG - 08/24/19 Stress Test - Denies ECHO - Denies Cardiac Cath - Denies  Sleep Study - Denies  DM - Denies  ERAS Protcol - Yes PRE-SURGERY Ensure given   COVID TEST- 08/24/19  Anesthesia review: No  Patient denies shortness of breath, fever, cough and chest pain at PAT appointment   All instructions explained to the patient, with a verbal understanding of the material. Patient agrees to go over the instructions while at home for a better understanding. Patient also instructed to self quarantine after being tested for COVID-19. The opportunity to ask questions was provided.

## 2019-08-27 ENCOUNTER — Encounter: Payer: Self-pay | Admitting: Nurse Practitioner

## 2019-08-27 NOTE — Progress Notes (Signed)
Ashley Pena does not require cardiac clearance prior to her hysterectomy. Dr. Vergie Living has been notified.

## 2019-08-27 NOTE — Progress Notes (Signed)
Anesthesia Chart Review:  Case: [id number] Date/Time: 08/28/19 1215   Procedures:      TOTAL LAPAROSCOPIC HYSTERECTOMY WITH SALPINGECTOMY (N/A )     CYSTOSCOPY (N/A )   Anesthesia type: Choice   Pre-op diagnosis: CIN 2-3   Location: [facility] OR ROOM 07 / [facility] OR   Surgeons: [place] Bing, MD      DISCUSSION: Patient is a 64 year old female scheduled for the above procedure.  History includes never smoker, hypertension, LEEP 12/2018.  EKG on 08/24/19 showed SR with sinus arrhythmia, septal infarct (age undetermined). No comparison EKG. she denied shortness of breath and chest pain per PAT RN visit. PCP Claiborne Rigg, NP documented on 08/27/19 at 8:28 AM, "Ms. Hodgkins does not require cardiac clearance prior to her hysterectomy. Dr. Vergie Living has been notified."  Preoperative COVID-19 test negative on 08/24/2019.  Anesthesia team to evaluate on the day of surgery. Notes indicate patient speaks [place] and Wendie Simmer, but can also speak [place].   VS: BP (!) 145/89   Pulse 82   Temp 36.7 C (Oral)   Resp 18   Ht 4\' 11"  (1.499 m)   Wt 55.9 kg   SpO2 100%   BMI 24.90 kg/m    PROVIDERS: , NP his PCP   LABS: Labs reviewed: Acceptable for surgery. (all labs ordered are listed, but only abnormal results are displayed)  Labs Reviewed  CBC - Abnormal; Notable for the following components:      Result Value   Hemoglobin 11.6 (*)    All other components within normal limits  COMPREHENSIVE METABOLIC PANEL - Abnormal; Notable for the following components:   CO2 21 (*)    All other components within normal limits  TYPE AND SCREEN     IMAGES: CXR 08/24/19: FINDINGS: The cardiomediastinal contours are normal. Aortic atherosclerosis. The lungs are clear. Pulmonary vasculature is normal. No consolidation, pleural effusion, or pneumothorax. Degenerative change in the thoracic spine. Degenerative change of both shoulders. No acute osseous abnormalities are seen. IMPRESSION: No acute  chest findings.   EKG: 08/24/2019: Sinus rhythm with marked sinus arrhythmia Septal infarct , age undetermined Abnormal ECG No previous tracing Confirmed by 10/24/2019 ([id number]) on 08/24/2019 6:55:03 PM   CV: Denied prior cardiac cath, stress test or echocardiogram.   Past Medical History:  Diagnosis Date  . Hypertension     Past Surgical History:  Procedure Laterality Date  . CESAREAN SECTION    . LEEP  01/17/2019    MEDICATIONS: . acetaminophen (TYLENOL) 325 MG tablet  . diclofenac sodium (VOLTAREN) 1 % GEL  . hydrochlorothiazide (HYDRODIURIL) 25 MG tablet  . ibuprofen (ADVIL,MOTRIN) 600 MG tablet  . MELATONIN PO   No current facility-administered medications for this encounter.    01/19/2019, PA-C Surgical Short Stay/Anesthesiology [facility] Phone [phone] [facility] Phone [phone] 08/27/2019 10:26 AM

## 2019-08-27 NOTE — Anesthesia Preprocedure Evaluation (Addendum)
Anesthesia Evaluation  Patient identified by MRN, date of birth, ID band Patient awake    Reviewed: Allergy & Precautions, NPO status , Patient's Chart, lab work & pertinent test results  Airway Mallampati: II  TM Distance: >3 FB Neck ROM: Full    Dental no notable dental hx. (+) Teeth Intact, Dental Advisory Given   Pulmonary neg pulmonary ROS,    Pulmonary exam normal breath sounds clear to auscultation       Cardiovascular hypertension, Pt. on medications Normal cardiovascular exam Rhythm:Regular Rate:Normal     Neuro/Psych negative neurological ROS  negative psych ROS   GI/Hepatic negative GI ROS, Neg liver ROS,   Endo/Other  negative endocrine ROS  Renal/GU negative Renal ROS  Female GU complaint CIN 2-3    Musculoskeletal negative musculoskeletal ROS (+)   Abdominal Normal abdominal exam  (+)   Peds  Hematology negative hematology ROS (+) hct 36.3, plt 350   Anesthesia Other Findings   Reproductive/Obstetrics negative OB ROS Postmenopausal                            Anesthesia Physical Anesthesia Plan  ASA: II  Anesthesia Plan: General   Post-op Pain Management:    Induction: Intravenous  PONV Risk Score and Plan: 4 or greater and Ondansetron, Dexamethasone, Midazolam, Scopolamine patch - Pre-op and Treatment may vary due to age or medical condition  Airway Management Planned: Oral ETT  Additional Equipment: None  Intra-op Plan:   Post-operative Plan: Extubation in OR  Informed Consent: I have reviewed the patients History and Physical, chart, labs and discussed the procedure including the risks, benefits and alternatives for the proposed anesthesia with the patient or authorized representative who has indicated his/her understanding and acceptance.     Dental advisory given  Plan Discussed with: CRNA  Anesthesia Plan Comments:       Anesthesia Quick  Evaluation

## 2019-08-28 ENCOUNTER — Ambulatory Visit (HOSPITAL_COMMUNITY): Payer: Medicare Other

## 2019-08-28 ENCOUNTER — Ambulatory Visit (HOSPITAL_COMMUNITY): Payer: Medicare Other | Admitting: Vascular Surgery

## 2019-08-28 ENCOUNTER — Other Ambulatory Visit: Payer: Self-pay

## 2019-08-28 ENCOUNTER — Encounter (HOSPITAL_COMMUNITY)
Admission: AD | Disposition: A | Discharge status: Home / Self Care | Payer: Self-pay | Source: Home / Self Care | Attending: Obstetrics and Gynecology

## 2019-08-28 ENCOUNTER — Ambulatory Visit (HOSPITAL_COMMUNITY)
Admission: AD | Admit: 2019-08-28 | Discharge: 2019-08-28 | Disposition: A | Discharge status: Home / Self Care | Payer: Medicare Other | Attending: Obstetrics and Gynecology | Admitting: Obstetrics and Gynecology

## 2019-08-28 ENCOUNTER — Encounter (HOSPITAL_COMMUNITY): Payer: Self-pay | Admitting: Obstetrics and Gynecology

## 2019-08-28 DIAGNOSIS — Z79899 Other long term (current) drug therapy: Secondary | ICD-10-CM | POA: Diagnosis not present

## 2019-08-28 DIAGNOSIS — Z87891 Personal history of nicotine dependence: Secondary | ICD-10-CM | POA: Diagnosis not present

## 2019-08-28 DIAGNOSIS — Z780 Asymptomatic menopausal state: Secondary | ICD-10-CM | POA: Diagnosis not present

## 2019-08-28 DIAGNOSIS — N952 Postmenopausal atrophic vaginitis: Secondary | ICD-10-CM | POA: Diagnosis not present

## 2019-08-28 DIAGNOSIS — R87613 High grade squamous intraepithelial lesion on cytologic smear of cervix (HGSIL): Secondary | ICD-10-CM

## 2019-08-28 DIAGNOSIS — I10 Essential (primary) hypertension: Secondary | ICD-10-CM | POA: Insufficient documentation

## 2019-08-28 DIAGNOSIS — D069 Carcinoma in situ of cervix, unspecified: Secondary | ICD-10-CM | POA: Diagnosis present

## 2019-08-28 DIAGNOSIS — D060 Carcinoma in situ of endocervix: Secondary | ICD-10-CM

## 2019-08-28 DIAGNOSIS — N899 Noninflammatory disorder of vagina, unspecified: Secondary | ICD-10-CM | POA: Diagnosis present

## 2019-08-28 DIAGNOSIS — Z8249 Family history of ischemic heart disease and other diseases of the circulatory system: Secondary | ICD-10-CM | POA: Diagnosis not present

## 2019-08-28 DIAGNOSIS — Z90710 Acquired absence of both cervix and uterus: Secondary | ICD-10-CM

## 2019-08-28 HISTORY — PX: TOTAL LAPAROSCOPIC HYSTERECTOMY WITH SALPINGECTOMY: SHX6742

## 2019-08-28 HISTORY — PX: CYSTOSCOPY: SHX5120

## 2019-08-28 LAB — ABO/RH: ABO/RH(D): B POS

## 2019-08-28 SURGERY — HYSTERECTOMY, TOTAL, LAPAROSCOPIC, WITH SALPINGECTOMY
Anesthesia: General | Site: Bladder

## 2019-08-28 MED ORDER — HYDRALAZINE HCL 20 MG/ML IJ SOLN
INTRAMUSCULAR | Status: AC
  Filled 2019-08-28: qty 1

## 2019-08-28 MED ORDER — OXYCODONE-ACETAMINOPHEN 5-325 MG PO TABS: 1.0000 | ORAL_TABLET | Freq: Four times a day (QID) | ORAL | 0 refills | Status: DC | PRN

## 2019-08-28 MED ORDER — DEXAMETHASONE SODIUM PHOSPHATE 10 MG/ML IJ SOLN
INTRAMUSCULAR | Status: AC
  Filled 2019-08-28: qty 2

## 2019-08-28 MED ORDER — ROCURONIUM BROMIDE 10 MG/ML (PF) SYRINGE
PREFILLED_SYRINGE | INTRAVENOUS | Status: DC | PRN
  Administered 2019-08-28: 50 mg via INTRAVENOUS

## 2019-08-28 MED ORDER — LABETALOL HCL 5 MG/ML IV SOLN
INTRAVENOUS | Status: DC | PRN
  Administered 2019-08-28: 5 mg via INTRAVENOUS

## 2019-08-28 MED ORDER — FENTANYL CITRATE (PF) 250 MCG/5ML IJ SOLN
INTRAMUSCULAR | Status: DC | PRN
  Administered 2019-08-28: 100 ug via INTRAVENOUS

## 2019-08-28 MED ORDER — ESMOLOL HCL 100 MG/10ML IV SOLN
INTRAVENOUS | Status: DC | PRN
Start: 2019-08-28 — End: 2019-08-28
  Administered 2019-08-28: 10 mg via INTRAVENOUS

## 2019-08-28 MED ORDER — LIDOCAINE 2% (20 MG/ML) 5 ML SYRINGE
INTRAMUSCULAR | Status: AC
  Filled 2019-08-28: qty 5

## 2019-08-28 MED ORDER — ONDANSETRON HCL 4 MG/2ML IJ SOLN
INTRAMUSCULAR | Status: DC | PRN
  Administered 2019-08-28: 4 mg via INTRAVENOUS

## 2019-08-28 MED ORDER — CEFAZOLIN SODIUM-DEXTROSE 2-4 GM/100ML-% IV SOLN
2.0000 g | INTRAVENOUS | Status: AC
  Administered 2019-08-28: 2 g via INTRAVENOUS
  Filled 2019-08-28: qty 100

## 2019-08-28 MED ORDER — SODIUM CHLORIDE 0.9 % IR SOLN
Status: DC | PRN
  Administered 2019-08-28: 3000 mL

## 2019-08-28 MED ORDER — PHENYLEPHRINE 40 MCG/ML (10ML) SYRINGE FOR IV PUSH (FOR BLOOD PRESSURE SUPPORT)
PREFILLED_SYRINGE | INTRAVENOUS | Status: DC | PRN
  Administered 2019-08-28: 80 ug via INTRAVENOUS

## 2019-08-28 MED ORDER — KETOROLAC TROMETHAMINE 30 MG/ML IJ SOLN: 15.0000 mg | Freq: Once | INTRAMUSCULAR | Status: DC | PRN

## 2019-08-28 MED ORDER — ROCURONIUM BROMIDE 10 MG/ML (PF) SYRINGE
PREFILLED_SYRINGE | INTRAVENOUS | Status: AC
  Filled 2019-08-28: qty 10

## 2019-08-28 MED ORDER — PHENYLEPHRINE 40 MCG/ML (10ML) SYRINGE FOR IV PUSH (FOR BLOOD PRESSURE SUPPORT)
PREFILLED_SYRINGE | INTRAVENOUS | Status: AC
  Filled 2019-08-28: qty 10

## 2019-08-28 MED ORDER — EPHEDRINE SULFATE 50 MG/ML IJ SOLN
INTRAMUSCULAR | Status: DC | PRN
  Administered 2019-08-28: 5 mg via INTRAVENOUS

## 2019-08-28 MED ORDER — STERILE WATER FOR IRRIGATION IR SOLN
Status: DC | PRN
Start: 2019-08-28 — End: 2019-08-28
  Administered 2019-08-28: 1000 mL

## 2019-08-28 MED ORDER — SUCCINYLCHOLINE CHLORIDE 200 MG/10ML IV SOSY
PREFILLED_SYRINGE | INTRAVENOUS | Status: AC
  Filled 2019-08-28: qty 10

## 2019-08-28 MED ORDER — PROPOFOL 10 MG/ML IV BOLUS
INTRAVENOUS | Status: AC
  Filled 2019-08-28: qty 20

## 2019-08-28 MED ORDER — DEXAMETHASONE SODIUM PHOSPHATE 10 MG/ML IJ SOLN
INTRAMUSCULAR | Status: DC | PRN
  Administered 2019-08-28: 10 mg via INTRAVENOUS

## 2019-08-28 MED ORDER — BUPIVACAINE HCL 0.5 % IJ SOLN
INTRAMUSCULAR | Status: DC | PRN
  Administered 2019-08-28: 30 mL

## 2019-08-28 MED ORDER — HYDRALAZINE HCL 20 MG/ML IJ SOLN
5.0000 mg | INTRAMUSCULAR | Status: DC | PRN
  Administered 2019-08-28: 5 mg via INTRAVENOUS

## 2019-08-28 MED ORDER — POVIDONE-IODINE 10 % EX SWAB
2.0000 "application " | Freq: Once | CUTANEOUS | Status: AC
  Administered 2019-08-28: 2 via TOPICAL

## 2019-08-28 MED ORDER — HYDROMORPHONE HCL 1 MG/ML IJ SOLN: 0.2500 mg | INTRAMUSCULAR | Status: DC | PRN

## 2019-08-28 MED ORDER — CHLORHEXIDINE GLUCONATE 0.12 % MT SOLN
OROMUCOSAL | Status: AC
  Filled 2019-08-28: qty 15

## 2019-08-28 MED ORDER — OXYCODONE HCL 5 MG PO TABS: 5.0000 mg | ORAL_TABLET | Freq: Once | ORAL | Status: DC | PRN

## 2019-08-28 MED ORDER — OXYCODONE HCL 5 MG/5ML PO SOLN: 5.0000 mg | Freq: Once | ORAL | Status: DC | PRN

## 2019-08-28 MED ORDER — ACETAMINOPHEN 500 MG PO TABS
1000.0000 mg | ORAL_TABLET | Freq: Once | ORAL | Status: AC
  Administered 2019-08-28: 1000 mg via ORAL
  Filled 2019-08-28: qty 2

## 2019-08-28 MED ORDER — LIDOCAINE 2% (20 MG/ML) 5 ML SYRINGE
INTRAMUSCULAR | Status: DC | PRN
  Administered 2019-08-28: 50 mg via INTRAVENOUS

## 2019-08-28 MED ORDER — LACTATED RINGERS IV SOLN: INTRAVENOUS | Status: DC

## 2019-08-28 MED ORDER — FENTANYL CITRATE (PF) 250 MCG/5ML IJ SOLN
INTRAMUSCULAR | Status: AC
  Filled 2019-08-28: qty 5

## 2019-08-28 MED ORDER — ONDANSETRON HCL 4 MG/2ML IJ SOLN
INTRAMUSCULAR | Status: AC
  Filled 2019-08-28: qty 2

## 2019-08-28 MED ORDER — FLUORESCEIN SODIUM 10 % IV SOLN
INTRAVENOUS | Status: AC
  Filled 2019-08-28: qty 5

## 2019-08-28 MED ORDER — MIDAZOLAM HCL 2 MG/2ML IJ SOLN
INTRAMUSCULAR | Status: AC
  Filled 2019-08-28: qty 2

## 2019-08-28 MED ORDER — PROMETHAZINE HCL 25 MG/ML IJ SOLN: 6.2500 mg | INTRAMUSCULAR | Status: DC | PRN

## 2019-08-28 MED ORDER — SUGAMMADEX SODIUM 200 MG/2ML IV SOLN
INTRAVENOUS | Status: DC | PRN
  Administered 2019-08-28: 200 mg via INTRAVENOUS

## 2019-08-28 MED ORDER — FLUORESCEIN SODIUM 10 % IV SOLN
INTRAVENOUS | Status: DC | PRN
Start: 2019-08-28 — End: 2019-08-28
  Administered 2019-08-28: .5 mL via INTRAVENOUS

## 2019-08-28 MED ORDER — PROPOFOL 10 MG/ML IV BOLUS
INTRAVENOUS | Status: DC | PRN
  Administered 2019-08-28: 150 mg via INTRAVENOUS

## 2019-08-28 MED ORDER — EPHEDRINE 5 MG/ML INJ
INTRAVENOUS | Status: AC
  Filled 2019-08-28: qty 10

## 2019-08-28 MED ORDER — POLYETHYLENE GLYCOL 3350 17 G PO PACK: 17.0000 g | PACK | Freq: Every day | ORAL | 0 refills | Status: AC

## 2019-08-28 MED ORDER — MEPERIDINE HCL 25 MG/ML IJ SOLN: 6.2500 mg | INTRAMUSCULAR | Status: DC | PRN

## 2019-08-28 MED ORDER — MIDAZOLAM HCL 5 MG/5ML IJ SOLN
INTRAMUSCULAR | Status: DC | PRN
  Administered 2019-08-28: 2 mg via INTRAVENOUS

## 2019-08-28 MED ORDER — BUPIVACAINE HCL (PF) 0.5 % IJ SOLN
INTRAMUSCULAR | Status: AC
  Filled 2019-08-28: qty 30

## 2019-08-28 MED ORDER — PHENYLEPHRINE HCL-NACL 10-0.9 MG/250ML-% IV SOLN
INTRAVENOUS | Status: DC | PRN
Start: 2019-08-28 — End: 2019-08-28
  Administered 2019-08-28: 20 ug/min via INTRAVENOUS

## 2019-08-28 SURGICAL SUPPLY — 54 items
APPLICATOR ARISTA FLEXITIP XL (MISCELLANEOUS) IMPLANT
BARRIER ADHS 3X4 INTERCEED (GAUZE/BANDAGES/DRESSINGS) IMPLANT
BLADE SURG 15 STRL LF DISP TIS (BLADE) ×2 IMPLANT
BLADE SURG 15 STRL SS (BLADE) ×2
CABLE HIGH FREQUENCY MONO STRZ (ELECTRODE) ×4 IMPLANT
CANISTER SUCT 3000ML PPV (MISCELLANEOUS) ×4 IMPLANT
DECANTER SPIKE VIAL GLASS SM (MISCELLANEOUS) ×4 IMPLANT
DEFOGGER SCOPE WARMER CLEARIFY (MISCELLANEOUS) ×4 IMPLANT
DERMABOND ADVANCED (GAUZE/BANDAGES/DRESSINGS) ×2
DERMABOND ADVANCED .7 DNX12 (GAUZE/BANDAGES/DRESSINGS) ×2 IMPLANT
DEVICE SUTURE ENDOST 10MM (ENDOMECHANICALS) IMPLANT
DURAPREP 26ML APPLICATOR (WOUND CARE) ×4 IMPLANT
GLOVE BIOGEL PI IND STRL 7.0 (GLOVE) ×4 IMPLANT
GLOVE BIOGEL PI IND STRL 7.5 (GLOVE) ×4 IMPLANT
GLOVE BIOGEL PI INDICATOR 7.0 (GLOVE) ×4
GLOVE BIOGEL PI INDICATOR 7.5 (GLOVE) ×4
GLOVE SURG SS PI 7.0 STRL IVOR (GLOVE) ×16 IMPLANT
GOWN STRL REUS W/ TWL LRG LVL3 (GOWN DISPOSABLE) ×6 IMPLANT
GOWN STRL REUS W/ TWL XL LVL3 (GOWN DISPOSABLE) ×4 IMPLANT
GOWN STRL REUS W/TWL LRG LVL3 (GOWN DISPOSABLE) ×6
GOWN STRL REUS W/TWL XL LVL3 (GOWN DISPOSABLE) ×4
GRASPER SUT TROCAR 14GX15 (MISCELLANEOUS) ×4 IMPLANT
HEMOSTAT ARISTA ABSORB 3G PWDR (HEMOSTASIS) IMPLANT
HIBICLENS CHG 4% 4OZ BTL (MISCELLANEOUS) ×4 IMPLANT
KIT TURNOVER KIT B (KITS) ×4 IMPLANT
LIGASURE VESSEL 5MM BLUNT TIP (ELECTROSURGICAL) ×4 IMPLANT
MANIPULATOR VCARE LG CRV RETR (MISCELLANEOUS) IMPLANT
MANIPULATOR VCARE SML CRV RETR (MISCELLANEOUS) ×4 IMPLANT
MANIPULATOR VCARE STD CRV RETR (MISCELLANEOUS) IMPLANT
OCCLUDER COLPOPNEUMO (BALLOONS) ×4 IMPLANT
PACK LAPAROSCOPY BASIN (CUSTOM PROCEDURE TRAY) ×4 IMPLANT
PACK TRENDGUARD 450 HYBRID PRO (MISCELLANEOUS) ×2 IMPLANT
PAD ARMBOARD 7.5X6 YLW CONV (MISCELLANEOUS) ×8 IMPLANT
PAD OB MATERNITY 4.3X12.25 (PERSONAL CARE ITEMS) ×4 IMPLANT
POUCH LAPAROSCOPIC INSTRUMENT (MISCELLANEOUS) ×12 IMPLANT
SCISSORS LAP 5X35 DISP (ENDOMECHANICALS) ×4 IMPLANT
SET IRRIG TUBING LAPAROSCOPIC (IRRIGATION / IRRIGATOR) ×4 IMPLANT
SET IRRIG Y TYPE TUR BLADDER L (SET/KITS/TRAYS/PACK) IMPLANT
SET TUBE SMOKE EVAC HIGH FLOW (TUBING) ×4 IMPLANT
SOLUTION ELECTROLUBE (MISCELLANEOUS) IMPLANT
SUT ENDO VLOC 180-0-8IN (SUTURE) IMPLANT
SUT MNCRL AB 4-0 PS2 18 (SUTURE) ×8 IMPLANT
SUT VICRYL 0 UR6 27IN ABS (SUTURE) ×12 IMPLANT
SYR 10ML LL (SYRINGE) ×4 IMPLANT
SYR 50ML LL SCALE MARK (SYRINGE) ×4 IMPLANT
SYSTEM CARTER THOMASON II (TROCAR) IMPLANT
TOWEL GREEN STERILE FF (TOWEL DISPOSABLE) ×8 IMPLANT
TRAY FOLEY W/BAG SLVR 14FR LF (SET/KITS/TRAYS/PACK) ×4 IMPLANT
TRENDGUARD 450 HYBRID PRO PACK (MISCELLANEOUS) ×4
TROCAR ADV FIXATION 5X100MM (TROCAR) IMPLANT
TROCAR BALLN 12MMX100 BLUNT (TROCAR) ×4 IMPLANT
TROCAR XCEL NON-BLD 11X100MML (ENDOMECHANICALS) ×4 IMPLANT
TROCAR XCEL NON-BLD 5MMX100MML (ENDOMECHANICALS) ×8 IMPLANT
UNDERPAD 30X36 HEAVY ABSORB (UNDERPADS AND DIAPERS) ×4 IMPLANT

## 2019-08-28 NOTE — Anesthesia Postprocedure Evaluation (Signed)
Anesthesia Post Note  Patient: Ashley Pena  Procedure(s) Performed: TOTAL LAPAROSCOPIC HYSTERECTOMY WITH SALPINGECTOMY (Bilateral Abdomen) CYSTOSCOPY (N/A Bladder)     Patient location during evaluation: PACU Anesthesia Type: General Level of consciousness: awake and alert Pain management: pain level controlled Vital Signs Assessment: post-procedure vital signs reviewed and stable Respiratory status: spontaneous breathing, nonlabored ventilation, respiratory function stable and patient connected to nasal cannula oxygen Cardiovascular status: blood pressure returned to baseline and stable Postop Assessment: no apparent nausea or vomiting Anesthetic complications: no   No complications documented.  Last Vitals:  Vitals:   08/28/19 1807 08/28/19 1822  BP: (!) 150/83 (!) 148/87  Pulse: 76 86  Resp: 17 16  Temp:  36.7 C  SpO2: 97% 98%    Last Pain:  Vitals:   08/28/19 1822  TempSrc:   PainSc: 0-No pain                 Gustave Lindeman COKER

## 2019-08-28 NOTE — Discharge Instructions (Signed)
General Anesthesia, Adult, Care After This sheet gives you information about how to care for yourself after your procedure. Your health care provider may also give you more specific instructions. If you have problems or questions, contact your health care provider. What can I expect after the procedure? After the procedure, the following side effects are common:  Pain or discomfort at the IV site.  Nausea.  Vomiting.  Sore throat.  Trouble concentrating.  Feeling cold or chills.  Weak or tired.  Sleepiness and fatigue.  Soreness and body aches. These side effects can affect parts of the body that were not involved in surgery. Follow these instructions at home:  For at least 24 hours after the procedure:  Have a responsible adult stay with you. It is important to have someone help care for you until you are awake and alert.  Rest as needed.  Do not: ? Participate in activities in which you could fall or become injured. ? Drive. ? Use heavy machinery. ? Drink alcohol. ? Take sleeping pills or medicines that cause drowsiness. ? Make important decisions or sign legal documents. ? Take care of children on your own. Eating and drinking  Follow any instructions from your health care provider about eating or drinking restrictions.  When you feel hungry, start by eating small amounts of foods that are soft and easy to digest (bland), such as toast. Gradually return to your regular diet.  Drink enough fluid to keep your urine pale yellow.  If you vomit, rehydrate by drinking water, juice, or clear broth. General instructions  If you have sleep apnea, surgery and certain medicines can increase your risk for breathing problems. Follow instructions from your health care provider about wearing your sleep device: ? Anytime you are sleeping, including during daytime naps. ? While taking prescription pain medicines, sleeping medicines, or medicines that make you drowsy.  Return to  your normal activities as told by your health care provider. Ask your health care provider what activities are safe for you.  Take over-the-counter and prescription medicines only as told by your health care provider.  If you smoke, do not smoke without supervision.  Keep all follow-up visits as told by your health care provider. This is important. Contact a health care provider if:  You have nausea or vomiting that does not get better with medicine.  You cannot eat or drink without vomiting.  You have pain that does not get better with medicine.  You are unable to pass urine.  You develop a skin rash.  You have a fever.  You have redness around your IV site that gets worse. Get help right away if:  You have difficulty breathing.  You have chest pain.  You have blood in your urine or stool, or you vomit blood. Summary  After the procedure, it is common to have a sore throat or nausea. It is also common to feel tired.  Have a responsible adult stay with you for the first 24 hours after general anesthesia. It is important to have someone help care for you until you are awake and alert.  When you feel hungry, start by eating small amounts of foods that are soft and easy to digest (bland), such as toast. Gradually return to your regular diet.  Drink enough fluid to keep your urine pale yellow.  Return to your normal activities as told by your health care provider. Ask your health care provider what activities are safe for you. This information is not   intended to replace advice given to you by your health care provider. Make sure you discuss any questions you have with your health care provider. Document Revised: 01/07/2017 Document Reviewed: 08/20/2016 Elsevier Patient Education  2020 Elsevier Inc. Laparoscopic Surgery Discharge Instructions  Instructions Following Major Laparoscopic Surgery You have just undergone a major laparoscopic surgery.  The following list should  answer your most common questions.  Although we will discuss your surgery and post-operative instructions with you prior to your discharge, this list will serve as a reminder if you fail to recall the details of what we discussed.  We will discuss your surgery once again in detail at your post-op visit in two to four weeks. If you haven't already done so, please call to make your appointment as soon as possible.  How you will feel: Although you have just undergone a major surgery, your recovery will be significantly shorter since the surgery was performed through much smaller incisions than the traditional approach.  You should feel slightly better each day.  If you suddenly feel much worse than the prior day, please call the clinic.  It's important during the early part of your recovery that you maintain some activity.  Walking is encouraged.  You will quicken your recovery by continued activity.  Incision:  Your incisions will be closed with dissolvable stitches or surgical adhesive (glue).  There may be Band-aids and/or Steri-strips covering your incisions.  If there is no drainage from the incisions you may remove the Band-aids in one to two days.  You may notice some minor bruising at the incision sites.  This is common and will resolve within several days.  Please inform us if the redness at the edges of your incision appears to be spreading.  If the skin around your incision becomes warm to the touch, or if you notice a pus-like drainage, please call the office.  Vaginal Discharge Following a Laparoscopic Hysterectomy: Minor vaginal bleeding or spotting is normal following a hysterectomy.  Bleeding similar to the amount of your period is excessive, and you should inform us of this immediately.  Vaginal spotting may continue for several weeks following your surgery.  You may notice a yellowish discharge which occasionally occurs as the vaginal stitches dissolve, and may last for several  weeks.  Sexual Activity Following a Hysterectomy: Do not have sexual intercourse or place tampons or douches in the vagina prior to your first office visit.  We will discuss when you may resume these activities at that visit.    Stairs/Driving/Activities: You may climb stairs if necessary.  If you've had general anesthesia, do not drive a car the rest of the day today.  You may begin light housework when you feel up to it, but avoid heavy lifting (more than 15-20lbs) or pushing until cleared for these activities by your physician.  Hygiene:  Do not soak your incisions.  Showers are acceptable but you may not take a bath or swim in a pool.  Cleanse your incisions daily with soap and water.  Medications:  Please resume taking any medications that you were taking prior to the surgery.  If we have prescribed any new medications for you, please take them as directed.  Constipation:  It is fairly common to experience some difficulty in moving your bowels following major surgery.  Being active will help to reduce this likelihood. A diet rich in fiber and plenty of liquids is desirable.  If you do become constipated, a mild laxative such as  Miralax, Milk of Magnesia, or Metamucil, or a stool softener such as Colace, is recommended.  General Instructions: If you develop a fever of 100.5 degrees or higher, please call the office number(s) below for physician on call.

## 2019-08-28 NOTE — H&P (Signed)
Obstetrics & Gynecology Surgical H&P   Date of Surgery: 08/28/2019    Primary OBGYN: [facility]  Reason for Admission: scheduled hysterectomy  History of Present Illness: Ms. Cherry is a 64 y.o. [phone] (No LMP recorded. Patient is postmenopausal.), with the above CC. 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 Given difficulty in repeat leep or repeating biopsies, I recommended hysterectomy.   ROS: A 12-point review of systems was performed and negative, except as stated in the above HPI.  OBGYN History: As per HPI. OB History  Gravida Para Term Preterm AB Living  8       3 5   SAB TAB Ectopic Multiple Live Births  3       5    # Outcome Date GA Lbr Len/2nd Weight Sex Delivery Anes PTL Lv  8 Gravida           7 Gravida           6 Gravida           5 Gravida           4 Gravida           3 SAB           2 SAB           1 SAB               Past Medical History: Past Medical History:  Diagnosis Date  . Hypertension     Past Surgical History: Past Surgical History:  Procedure Laterality Date  . CESAREAN SECTION    . LEEP  01/17/2019    Family History:  Family History  Problem Relation Age of Onset  . Hypertension Mother     Social History:  Social History   Socioeconomic History  . Marital status: Single    Spouse name: Not on file  . Number of children: Not on file  . Years of education: Not on file  . Highest education level: Not on file  Occupational History  . Not on file  Tobacco Use  . Smoking status: Never Smoker  . Smokeless tobacco: Never Used  Vaping Use  . Vaping Use: Former  Substance and Sexual Activity  . Alcohol use: No  . Drug use: No  . Sexual activity: Not Currently  Other Topics Concern  . Not on file  Social History Narrative  . Not on file   Social Determinants of Health   Financial Resource Strain:   . Difficulty of Paying Living  Expenses:   Food Insecurity:   . Worried About 01/19/2019 in the Last Year:   . Programme researcher, broadcasting/film/video in the Last Year:   Transportation Needs: No Transportation Needs  . Lack of Transportation (Medical): No  . Lack of Transportation (Non-Medical): No  Physical Activity:   . Days of Exercise per Week:   . Minutes of Exercise per Session:   Stress:   . Feeling of Stress :   Social Connections:   . Frequency of Communication with Friends and Family:   . Frequency of Social Gatherings with Friends and Family:   . Attends Religious Services:   . Active Member of Clubs or Organizations:   . Attends Barista Meetings:   Banker Marital Status:   Intimate Partner Violence:   . Fear of Current or Ex-Partner:   . Emotionally Abused:   .  Physically Abused:   . Sexually Abused:     Allergy: No Known Allergies  Current Outpatient Medications: Medications Prior to Admission  Medication Sig Dispense Refill Last Dose  . acetaminophen (TYLENOL) 325 MG tablet Take 650 mg by mouth every 6 (six) hours as needed.   08/27/2019 at Unknown time  . hydrochlorothiazide (HYDRODIURIL) 25 MG tablet Take 1 tablet (25 mg total) by mouth daily. 90 tablet 2 08/27/2019 at Unknown time  . MELATONIN PO Take 1 tablet by mouth at bedtime as needed (sleep).    08/27/2019 at Unknown time  . diclofenac sodium (VOLTAREN) 1 % GEL Apply 4 g topically 4 (four) times daily. (Patient not taking: Reported on 05/09/2019) 100 g 1   . ibuprofen (ADVIL,MOTRIN) 600 MG tablet Take 1 tablet (600 mg total) by mouth every 8 (eight) hours as needed for moderate pain. (Patient not taking: Reported on 12/20/2018) 60 tablet 1      Hospital Medications: Current Facility-Administered Medications  Medication Dose Route Frequency Provider Last Rate Last Admin  . ceFAZolin (ANCEF) IVPB 2g/100 mL premix  2 g Intravenous To SS-Surg Chrishonda Hesch, Billey Gosling, MD      . chlorhexidine (PERIDEX) 0.12 % solution           . lactated ringers infusion    Intravenous Continuous Clive Bing, MD         Physical Exam:  Current Vital Signs 24h Vital Sign Ranges  T (!) 97.5 F (36.4 C) Temp  Avg: 97.5 F (36.4 C)  Min: 97.5 F (36.4 C)  Max: 97.5 F (36.4 C)  BP (!) 165/97 BP  Min: 165/97  Max: 165/97  HR (!) 102 Pulse  Avg: 102  Min: 102  Max: 102  RR 18 Resp  Avg: 18  Min: 18  Max: 18  SaO2 99 % Room Air SpO2  Avg: 99 %  Min: 99 %  Max: 99 %       24 Hour I/O Current Shift I/O  Time Ins Outs No intake/output data recorded. No intake/output data recorded.    Body mass index is 24.89 kg/m. General appearance: Well nourished, well developed female in no acute distress.  Cardiovascular: S1, S2 normal, no murmur, rub or gallop, regular rate and rhythm Respiratory:  Clear to auscultation bilateral. Normal respiratory effort Abdomen: positive bowel sounds and no masses, hernias; diffusely non tender to palpation, non distended Neuro/Psych:  Normal mood and affect.  Skin:  Warm and dry.  Extremities: no clubbing, cyanosis, or edema.  Lymphatic:  No inguinal lymphadenopathy.   Laboratory: COVID: neg Recent Labs  Lab 08/24/19 0947  WBC 4.4  HGB 11.6*  HCT 36.3  PLT 350   Recent Labs  Lab 08/24/19 0947  NA 141  K 3.6  CL 109  CO2 21*  BUN 8  CREATININE 0.75  CALCIUM 9.3  PROT 7.4  BILITOT 1.1  ALKPHOS 58  ALT 15  AST 19  GLUCOSE 89   No results for input(s): APTT, INR, PTT in the last 168 hours.  Invalid input(s): DRHAPTT Recent Labs  Lab 08/24/19 1012  ABORH B POS    Imaging:  Negative cxr  Assessment: Ms. Stivers is a 64 y.o. [id number] (No LMP recorded. Patient is postmenopausal.) here for scheduled hysterectomy. Pt stable  Plan: D/w pt and her daughter who interprets for her that plan is for laparoscopic total hysterectomy and to remove both tubes and ovaries. I d/w them that if able to do it laparoscopically that she can go  home from the PACU which they desire.   Can proceed when OR is ready.    Cornelia Copa MD Attending Center for [employer] (Faculty Practice) [phone]

## 2019-08-28 NOTE — Transfer of Care (Signed)
Immediate Anesthesia Transfer of Care Note  Patient: Ashley Pena  Procedure(s) Performed: TOTAL LAPAROSCOPIC HYSTERECTOMY WITH SALPINGECTOMY (Bilateral Abdomen) CYSTOSCOPY (N/A Bladder)  Patient Location: PACU  Anesthesia Type:General  Level of Consciousness: drowsy  Airway & Oxygen Therapy: Patient Spontanous Breathing  Post-op Assessment: Report given to RN and Post -op Vital signs reviewed and stable  Post vital signs: Reviewed and stable BP 166/101 - dr Noreene Larsson notified and RN at bedside ; 5mg  labetalol given.   Last Vitals:  Vitals Value Taken Time  BP 166/101 08/28/19 1737  Temp 36.7 C 08/28/19 1737  Pulse 75 08/28/19 1740  Resp 15 08/28/19 1740  SpO2 94 % 08/28/19 1740  Vitals shown include unvalidated device data.  Last Pain:  Vitals:   08/28/19 1737  TempSrc:   PainSc: Asleep      Patients Stated Pain Goal: 1 (08/28/19 1127)  Complications: No complications documented.

## 2019-08-28 NOTE — Brief Op Note (Signed)
08/28/2019  5:18 PM  PATIENT:  Ashley Pena  64 y.o. 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  POST-OPERATIVE DIAGNOSIS:  CIN 2-3  PROCEDURE:  Total Laparoscopic hysterectomy, bilateral salpingo-oophorectomy, cystoscopy SURGEON:  Surgeon(s) and Role:    * [place] Bing, MD - Primary    * Conan Bowens, MD - Assisting   ANESTHESIA:   local and general  EBL:  20 mL   BLOOD ADMINISTERED:none  DRAINS: indwelling foley, uop per anesthesia note   LOCAL MEDICATIONS USED:  MARCAINE     SPECIMEN:  Cervix, uterus, both tubes and ovaries  DISPOSITION OF SPECIMEN:  PATHOLOGY  COUNTS:  YES  TOURNIQUET:  * No tourniquets in log *  DICTATION: .Note written in EPIC  PLAN OF CARE: Discharge to home after PACU  PATIENT DISPOSITION:  PACU - hemodynamically stable.   Delay start of Pharmacological VTE agent (>24hrs) due to surgical blood loss or risk of bleeding: not applicable  Cornelia Copa MD Attending Center for [employer] (Faculty Practice) 08/28/2019 Time: [phone]

## 2019-08-28 NOTE — Anesthesia Procedure Notes (Signed)
Procedure Name: Intubation Date/Time: 08/28/2019 3:24 PM Performed by: Shirlyn Goltz, CRNA Pre-anesthesia Checklist: Patient identified, Emergency Drugs available, Patient being monitored and Suction available Patient Re-evaluated:Patient Re-evaluated prior to induction Oxygen Delivery Method: Circle system utilized Preoxygenation: Pre-oxygenation with 100% oxygen Induction Type: IV induction Ventilation: Mask ventilation without difficulty Laryngoscope Size: Mac and 3 Grade View: Grade III Tube type: Oral Tube size: 7.0 mm Number of attempts: 1 Airway Equipment and Method: Stylet Placement Confirmation: ETT inserted through vocal cords under direct vision,  positive ETCO2 and breath sounds checked- equal and bilateral Secured at: 21 cm Tube secured with: Tape Dental Injury: Teeth and Oropharynx as per pre-operative assessment

## 2019-08-29 ENCOUNTER — Telehealth: Payer: Self-pay | Admitting: *Deleted

## 2019-08-29 ENCOUNTER — Encounter (HOSPITAL_COMMUNITY): Payer: Self-pay | Admitting: Obstetrics and Gynecology

## 2019-08-29 DIAGNOSIS — Z90710 Acquired absence of both cervix and uterus: Secondary | ICD-10-CM

## 2019-08-29 NOTE — Telephone Encounter (Signed)
Assisted by Windell Moulding, Interpreter # [phone], attempted to patient on cell phone to complete transition of care assessment. Was told this is not the correct number.  Burnard Bunting, RN, BSN, CCRN Patient Engagement Center [phone]

## 2019-08-29 NOTE — Addendum Note (Signed)
Addended by: Redmond Baseman on: 08/29/2019 02:36 PM   Modules accepted: Orders

## 2019-08-29 NOTE — Telephone Encounter (Addendum)
Assisted by Windell Moulding, Interpreter # [phone], attempted to patient on home phone to complete transition of care assessment. Left message on home phone. Order placed for [facility] Coordination due to language barrier. Pt speaks [place]   Burnard Bunting, RN, BSN, CCRN Patient Engagement Center [phone]

## 2019-08-30 LAB — SURGICAL PATHOLOGY

## 2019-08-30 NOTE — Op Note (Signed)
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  This assistant was needed for exposure, dissection, suctioning, retraction, instrument exchange, and for overall help during the procedure    PROCEDURE: Total laparoscopic hysterectomy, bilateral salpingoophorectomy, cystoscopy  ANESTHESIA: General and local  ESTIMATED BLOOD LOSS: 49mL  DRAINS: per anesthesia note   TOTAL IV FLUIDS: per anesthesia note  VTE PROPHYLAXIS: SCDs to the bilateral lower extremities  ANTIBIOTICS: Two grams of Cefazolin were given. within 1 hour of skin incision  SPECIMENS: cervix, uterus, both tubes and ovaries  DISPOSITION: PACU - hemodynamically stable.  CONDITION: stable  COMPLICATIONS: None  FINDINGS: EGBUS normal, vaginal vault, normal, cervix near flush with vaginal wall (comes off about 80mm) with small closed dimple seen for external os. Normal stomach and liver edge; no intra-abdominal adhesions; grossly normal uterus, tubes and ovaries. Normal bladder dome and +efflux of from both ureteral orifices of green urine.   DESCRIPTION OF PROCEDURE: After informed consent was obtained, the patient was taken to the operating room where anesthesia was obtained without difficulty. The patient was positioned in the dorsal lithotomy position in [place] stirrups and her arms were carefully tucked at her sides and the usual precautions were taken.  She was prepped and draped in normal sterile fashion.  Time-out was performed and a Foley catheter was placed into the bladder. A standard VCare uterine manipulator was then placed in the uterus without incident. Gloves were then changed, and  after injection of local anesthesia, the open technique was used to place an infraumbilical 12-mm baloon trocar under direct visualization. The laparoscope was introduced and CO2 gas was infused for pneumoperitoneum to a pressure of 15 mm Hg and the area below inspected for injury.  The patient was placed in Trendelenburg and the bowel was displaced up into the upper abdomen, and the right and left lateral 5-mm ports and an 11 mm suprapubic port were placed under direct visualization of the laparoscope, after injection of local anesthesia.    The bilateral cornua were then cauterized with the Ligasure device, and the round ligaments were divided on each side with the Ligasure and the retroperitoneal space was opened bilaterally.  The ureters were identified and preserved.  A bladder flap was created and the bladder was dissected down off the lower uterine segment and cervix using Ligasure.  The broad ligaments were then opened, and the uterine arteries were skeletonized bilaterally, sealed and divided with the Ligasure device.  A colpotomy was performed circumferentially along the ring with electrocautery and the cervix was incised from the vagina and the specimen was removed through the vagina.  A pneumo balloon was placed in the vagina and the vaginal cuff was then closed in a running continuous fashion using the EndoStitch technique with 2-0 V-Lock suture with careful attention to include the vaginal cuff angles and the vaginal mucosa within the closure; the cystoscopy was then performed, with the above noted findings after IV administration of sodium fluorescein. The intraperitoneal pressure was dropped, and all planes of dissection, vascular pedicles and the vaginal cuff were found to be hemostatic.    The suprapubic trocar was removed and the fascia was  closed with 0-vicryl suture using the endoclose device. The lateral trocars were removed under visualization.   Before the umbilical trocar was removed the  CO2 gas was released.  The fascia there was closed with 0 vicryl suture in a figure of eight fashion.   The skin incision at the umbilicus was closed with a subcuticular stitch of 4-0 monocryl.  The remaining skin incisions were closed with Dermabond glue.  The patient tolerated the procedure well.  Sponge, lap and needle counts were correct x2.  The patient was taken to recovery room in excellent condition.  Cornelia Copa MD Attending Center for [employer] Midwife)

## 2019-08-31 ENCOUNTER — Encounter: Payer: Self-pay | Admitting: *Deleted

## 2019-08-31 ENCOUNTER — Other Ambulatory Visit: Payer: Self-pay | Admitting: *Deleted

## 2019-08-31 NOTE — Patient Outreach (Signed)
Care Coordination  08/31/2019  Ashley Pena 1955-12-02 [id number]  Attempted telephone outreach to patient Ashley Pena, referred to [facility] CM 08/29/19 by [facility] RN for Managed Medicaid follow up due to language barrier.  Patient had recent outpatient hospitalization for hysterectomy on 08/28/19; she was discharged home to self-care on same day.  She has had no recent ED or unplanned hospitalizations.  Patient has history including, but not limited to, cervical dysplasia and HTN.  09:15: contacted [employer], [phone], who were unable to provide an interpreter and unable to schedule appointment for same.  [employer] interpreters recommended that I re-attempt call next week  Discussed with [facility] CM leadership; will re-attempt outreach to interpreters next week as recommended  [name], RN, BSN, CCRN [facility] Coordinator [facility] Care Management  [phone]     .

## 2019-09-01 NOTE — Discharge Summary (Signed)
Gynecology Discharge Summary Date of Admission: 08/28/2019 Date of Discharge: 08/28/2019  The patient was admitted, as scheduled, and underwent a TLH/BS/cysto; please refer to operative note for full details.  She was meeting all post op goals and discharged to home from the PACU  Allergies as of 08/28/2019   No Known Allergies     Medication List    STOP taking these medications   acetaminophen 325 MG tablet Commonly known as: TYLENOL     TAKE these medications   diclofenac sodium 1 % Gel Commonly known as: VOLTAREN Apply 4 g topically 4 (four) times daily.   hydrochlorothiazide 25 MG tablet Commonly known as: HYDRODIURIL Take 1 tablet (25 mg total) by mouth daily.   ibuprofen 600 MG tablet Commonly known as: ADVIL Take 1 tablet (600 mg total) by mouth every 8 (eight) hours as needed for moderate pain.   MELATONIN PO Take 1 tablet by mouth at bedtime as needed (sleep).   oxyCODONE-acetaminophen 5-325 MG tablet Commonly known as: PERCOCET/ROXICET Take 1-2 tablets by mouth every 6 (six) hours as needed.   polyethylene glycol 17 g packet Commonly known as: MIRALAX / GLYCOLAX Take 17 g by mouth daily for 14 days.       Future Appointments  Date Time Provider Department Center  09/03/2019  1:00 PM Michaela Corner, RN [facility] None  10/08/2019 10:55 AM [place] Bing, MD [facility]    Cornelia Copa MD Attending Center for [facility])

## 2019-09-03 ENCOUNTER — Encounter: Payer: Self-pay | Admitting: *Deleted

## 2019-09-03 ENCOUNTER — Other Ambulatory Visit: Payer: Self-pay | Admitting: *Deleted

## 2019-09-03 NOTE — Patient Outreach (Signed)
Care Coordination [facility] CM Telephone [facility] Coordination  09/03/2019  Ashley Pena Dec 03, 1955 [id number]  Attempted telephone outreach to patient Ashley Pena, referred to [facility] CM 08/29/19 by [facility] RN for Managed Medicaid follow up due to language barrier.  Patient had recent outpatient hospitalization for elective hysterectomy on 08/28/19; she was discharged home to self-care on same day.  She has had no recent ED or unplanned hospitalizations.  Patient has history including, but not limited to, cervical dysplasia and HTN.  12:45 pm: contacted [employer], [phone], who were unable to provide an interpreter today; scheduled follow up call with interpreter through [facility] [employer] for Wed 09/05/19 at 1:00 pm   Confirmed that [employer] will send me secure e-mail confirming appointment date and time- tentatively scheduled today for Wednesday September 05, 2019; process is for me to contact [employer] at [phone]; reference #: [id number]-- if I receive follow up email confirming appointment.  Plan:  Will await follow up from [facility] resource group to confirm appointment to contact patient on Wednesday 09/05/19  [name], RN, BSN, CCRN [facility] Coordinator [facility] Care Management  [phone]

## 2019-09-05 ENCOUNTER — Encounter: Payer: Self-pay | Admitting: *Deleted

## 2019-09-05 ENCOUNTER — Other Ambulatory Visit: Payer: Self-pay | Admitting: *Deleted

## 2019-09-05 NOTE — Patient Outreach (Signed)
[employer] Customer service manager [facility]) Care Management [facility] CM Telephone Outreach, Screening outreach  09/05/2019  [name] Oct 31, 1955 [id number]  Attempted telephone outreach to patient Ashley Pena, referred to [facility] CM 08/29/19 by [facility] RN for Managed Medicaid follow up due to language barrier.  Patient had recent outpatient hospitalization for elective hysterectomy on 08/28/19; she was discharged home to self-care on same day. She has had no recent ED or unplanned hospitalizations.  Patient has history including, but not limited to, cervical dysplasia and HTN.  13:00 pm: contacted [employer] for scheduled appointment with translator, [phone], Reference [id number]  13:20 pm:  Per Arnell Asal, translator # [phone]; call placed to number on file as primary number for patient; spoke with patient's daughter/ HCPOA. "Synetta Pena," who reports she handles all of patient's medical affairs; verified same in EHR; HIPAA/ identity verified.  Purpose of call discussed with patient's daughter who offers that she is able to speak English; minimal assistance required by translator.  Daughter reports patient "doing great" post- recent elective surgery; daughter states she herself is a Engineer, civil (consulting) and has been monitoring patient; states that she is healing great, has no pain, has and is taking all medications as prescribed.  Reports patient lives by herself but has other family that check in with her regularly, as well as her daughter.    Confirms that patient has scheduled post-op office visit with surgeon; family will provide transportation and daughter will attend this appointment along with patient.  Daughter/ POA verbalizes no care coordination/ management/ disease management/ medication or community resource needs today.  Discussed that I would place [facility] CM letter in mail to patient in her language, however daughter stated that it is best to keep language in [place], as family nor patient reads [name]; daughter states that patient's family members do read [place] and will translate letter to patient.  Plan:  Will make patient inactive with [facility] CM as no concerns were identified after screening call today and will make patient's PCP aware of same  [name], RN, BSN, CCRN [facility] Coordinator [facility] Care Management  [phone]

## 2019-10-05 ENCOUNTER — Other Ambulatory Visit: Payer: Self-pay

## 2019-10-05 ENCOUNTER — Ambulatory Visit: Payer: Medicaid Other | Attending: Family Medicine | Admitting: Family Medicine

## 2019-10-05 ENCOUNTER — Encounter: Payer: Self-pay | Admitting: Family Medicine

## 2019-10-05 ENCOUNTER — Other Ambulatory Visit: Payer: Self-pay | Admitting: Family Medicine

## 2019-10-05 VITALS — BP 166/75 | HR 84 | Wt 124.2 lb

## 2019-10-05 DIAGNOSIS — I10 Essential (primary) hypertension: Secondary | ICD-10-CM | POA: Diagnosis not present

## 2019-10-05 MED ORDER — HYDROCHLOROTHIAZIDE 25 MG PO TABS: 25.0000 mg | ORAL_TABLET | Freq: Every day | ORAL | 2 refills | Status: DC

## 2019-10-05 MED FILL — HYDROCHLOROTHIAZIDE 25 MG T: 25 | 30 days supply | Qty: 30 | Fill #0

## 2019-10-05 NOTE — Progress Notes (Signed)
Established Patient Office Visit  Subjective:  Patient ID: Ashley Pena, female    DOB: October 11, 1955  Age: 64 y.o. MRN: [id number]  CC:  Chief Complaint  Patient presents with  . Hypertension    [name], 64 yo female, patient of Z. Meredeth Ide, NP, who presents in follow-up of Hypertension. She is accompanied by her daughter who states that patient has been out of her medications for the past 2-3 months. Patient denies any symptoms such as chest pain, headaches or dizziness. She does not wish to receive the influenza immunization.   Past Medical History:  Diagnosis Date  . Hypertension     Past Surgical History:  Procedure Laterality Date  . CESAREAN SECTION    . CYSTOSCOPY N/A 08/28/2019   Procedure: CYSTOSCOPY;  Surgeon: [place] Bing, MD;  Location: [facility] OR;  Service: Gynecology;  Laterality: N/A;  . LEEP  01/17/2019  . TOTAL LAPAROSCOPIC HYSTERECTOMY WITH SALPINGECTOMY Bilateral 08/28/2019   Procedure: TOTAL LAPAROSCOPIC HYSTERECTOMY WITH SALPINGECTOMY;  Surgeon: [place] Bing, MD;  Location: [facility] OR;  Service: Gynecology;  Laterality: Bilateral;    Family History  Problem Relation Age of Onset  . Hypertension Mother     Social History   Socioeconomic History  . Marital status: Single    Spouse name: Not on file  . Number of children: Not on file  . Years of education: Not on file  . Highest education level: Not on file  Occupational History  . Not on file  Tobacco Use  . Smoking status: Never Smoker  . Smokeless tobacco: Never Used  Vaping Use  . Vaping Use: Former  Substance and Sexual Activity  . Alcohol use: No  . Drug use: No  . Sexual activity: Not Currently  Other Topics Concern  . Not on file  Social History Narrative  . Not on file   Social Determinants of Health   Financial Resource Strain:   . Difficulty of Paying Living Expenses: Not on file  Food Insecurity: No Food Insecurity  . Worried About Programme researcher, broadcasting/film/video in the Last Year:  Never true  . Ran Out of Food in the Last Year: Never true  Transportation Needs: No Transportation Needs  . Lack of Transportation (Medical): No  . Lack of Transportation (Non-Medical): No  Physical Activity:   . Days of Exercise per Week: Not on file  . Minutes of Exercise per Session: Not on file  Stress:   . Feeling of Stress : Not on file  Social Connections:   . Frequency of Communication with Friends and Family: Not on file  . Frequency of Social Gatherings with Friends and Family: Not on file  . Attends Religious Services: Not on file  . Active Member of Clubs or Organizations: Not on file  . Attends Banker Meetings: Not on file  . Marital Status: Not on file  Intimate Partner Violence:   . Fear of Current or Ex-Partner: Not on file  . Emotionally Abused: Not on file  . Physically Abused: Not on file  . Sexually Abused: Not on file    Outpatient Medications Prior to Visit  Medication Sig Dispense Refill  . diclofenac sodium (VOLTAREN) 1 % GEL Apply 4 g topically 4 (four) times daily. (Patient not taking: Reported on 05/09/2019) 100 g 1  . hydrochlorothiazide (HYDRODIURIL) 25 MG tablet Take 1 tablet (25 mg total) by mouth daily. 90 tablet 2  . ibuprofen (ADVIL,MOTRIN) 600 MG tablet Take 1 tablet (600 mg total)  by mouth every 8 (eight) hours as needed for moderate pain. (Patient not taking: Reported on 12/20/2018) 60 tablet 1  . MELATONIN PO Take 1 tablet by mouth at bedtime as needed (sleep).     Marland Kitchen oxyCODONE-acetaminophen (PERCOCET/ROXICET) 5-325 MG tablet Take 1-2 tablets by mouth every 6 (six) hours as needed. 30 tablet 0   No facility-administered medications prior to visit.    No Known Allergies  ROS Review of Systems  Constitutional: Negative for chills and fever.  Respiratory: Negative for cough and shortness of breath.   Cardiovascular: Negative for chest pain, palpitations and leg swelling.  Gastrointestinal: Negative for abdominal pain,  constipation, diarrhea and nausea.  Genitourinary: Negative for dysuria and frequency.  Neurological: Negative for dizziness and headaches.      Objective:    Physical Exam Vitals and nursing note reviewed.  Constitutional:      Appearance: Normal appearance.     Comments: Thin older female in NAD sitting on exam chair and she is accompanied by her daughter at today's visit  Cardiovascular:     Rate and Rhythm: Normal rate and regular rhythm.  Pulmonary:     Effort: Pulmonary effort is normal.     Breath sounds: Normal breath sounds.  Abdominal:     Palpations: Abdomen is soft.     Tenderness: There is no abdominal tenderness. There is no guarding or rebound.  Musculoskeletal:     Right lower leg: No edema.     Left lower leg: No edema.  Neurological:     Mental Status: She is alert.  Psychiatric:        Mood and Affect: Mood normal.        Behavior: Behavior normal.     BP (!) 166/75   Pulse 84   Wt 124 lb 3.2 oz (56.3 kg)   SpO2 98%   BMI 25.09 kg/m  Wt Readings from Last 3 Encounters:  10/05/19 124 lb 3.2 oz (56.3 kg)  08/28/19 123 lb 3.8 oz (55.9 kg)  08/24/19 123 lb 4.8 oz (55.9 kg)     Health Maintenance Due  Topic Date Due  . COVID-19 Vaccine (1) Never done  . COLONOSCOPY  Never done  . DEXA SCAN  Never done  . PNA vac Low Risk Adult (1 of 2 - PCV13) Never done  . INFLUENZA VACCINE  08/19/2019   She was offered but declined influenza immunization at today's visit   Lab Results  Component Value Date   TSH 2.283 02/20/2013   Lab Results  Component Value Date   WBC 4.4 08/24/2019   HGB 11.6 (L) 08/24/2019   HCT 36.3 08/24/2019   MCV 85.4 08/24/2019   PLT 350 08/24/2019   Lab Results  Component Value Date   NA 141 08/24/2019   K 3.6 08/24/2019   CO2 21 (L) 08/24/2019   GLUCOSE 89 08/24/2019   BUN 8 08/24/2019   CREATININE 0.75 08/24/2019   BILITOT 1.1 08/24/2019   ALKPHOS 58 08/24/2019   AST 19 08/24/2019   ALT 15 08/24/2019   PROT  7.4 08/24/2019   ALBUMIN 3.9 08/24/2019   CALCIUM 9.3 08/24/2019   ANIONGAP 11 08/24/2019   Lab Results  Component Value Date   CHOL 181 03/28/2018   Lab Results  Component Value Date   HDL 56 03/28/2018   Lab Results  Component Value Date   LDLCALC 109 (H) 03/28/2018   Lab Results  Component Value Date   TRIG 81 03/28/2018   Lab  Results  Component Value Date   CHOLHDL 3.2 03/28/2018   No results found for: HGBA1C    Assessment & Plan:  1. Essential hypertension BP elevated today but patient has been out of medication for the past 2-3 months per her daughter. Refill provided for HCTZ and she is to schedule follow-up with her PCP in 3 weeks in follow-up of her blood pressure. She has had normal electrolytes and creatinine on blood work done on 08/24/2019 which was reviewed prior to her visit and patient/daughter also made aware. - hydrochlorothiazide (HYDRODIURIL) 25 MG tablet; Take 1 tablet (25 mg total) by mouth daily.  Dispense: 90 tablet; Refill: 2     Follow-up: Return in about 4 weeks (around 11/02/2019) for HTN f/u with PCP.    Cain Saupe, MD

## 2019-10-05 NOTE — Progress Notes (Signed)
Patient has been out of medication for a long time she sates.

## 2019-10-08 ENCOUNTER — Encounter: Payer: Self-pay | Admitting: Obstetrics and Gynecology

## 2019-10-08 ENCOUNTER — Ambulatory Visit (INDEPENDENT_AMBULATORY_CARE_PROVIDER_SITE_OTHER): Payer: Medicaid Other | Admitting: Obstetrics and Gynecology

## 2019-10-08 ENCOUNTER — Other Ambulatory Visit: Payer: Self-pay

## 2019-10-08 VITALS — BP 144/86 | HR 83 | Ht 62.0 in | Wt 123.3 lb

## 2019-10-08 DIAGNOSIS — N879 Dysplasia of cervix uteri, unspecified: Secondary | ICD-10-CM

## 2019-10-08 DIAGNOSIS — Z09 Encounter for follow-up examination after completed treatment for conditions other than malignant neoplasm: Secondary | ICD-10-CM

## 2019-10-08 NOTE — Progress Notes (Signed)
Pt states no pain nor bleeding. 

## 2019-10-09 NOTE — Progress Notes (Signed)
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 Pt was discharged home from the PACU. Pathology showed cin3 on the cervix with negative margins.   Patient is doing well and has no complaints or issues.   Review of Systems Pertinent items are noted in HPI.    Objective:    BP (!) 144/86 (BP Location: Left Arm)   Pulse 83   Ht 5\' 2"  (1.575 m)   Wt 123 lb 4.8 oz (55.9 kg)   BMI 22.55 kg/m  NAD Abdomen: soft, nttp, nd. C/d/i l/s port sites Pelvic EGBUS normal Vagina normal. Cuff normal, nttp Bimanual: negative Assessment:    Doing well postoperatively. Operative findings again reviewed. Pathology report discussed.    Plan:   I d/w her and her daughter that I recommend pap smears qyear for the next few years and then can do routine screening. I will let [facility] know  Pelvic rest for one more month  All questions asked and answered.  RTC: PRN

## 2019-11-12 ENCOUNTER — Ambulatory Visit: Payer: Medicaid Other | Admitting: Nurse Practitioner

## 2019-12-20 ENCOUNTER — Encounter: Payer: Self-pay | Admitting: General Practice

## 2019-12-21 ENCOUNTER — Ambulatory Visit: Payer: Medicaid Other | Admitting: Nurse Practitioner

## 2020-02-04 ENCOUNTER — Ambulatory Visit: Payer: Medicare Other | Attending: Nurse Practitioner | Admitting: Nurse Practitioner

## 2020-02-04 ENCOUNTER — Other Ambulatory Visit: Payer: Self-pay

## 2020-02-13 ENCOUNTER — Other Ambulatory Visit: Payer: Self-pay | Admitting: Nurse Practitioner

## 2020-02-13 DIAGNOSIS — I10 Essential (primary) hypertension: Secondary | ICD-10-CM

## 2020-02-13 NOTE — Telephone Encounter (Signed)
Medication Refill - Medication: Hydrochlorothiazide   Has the patient contacted their pharmacy? Yes.   Pt is completely out of this medication. Pt did set up next available appt with provider on 03/28/20. Please advise. (Agent: If no, request that the patient contact the pharmacy for the refill.) (Agent: If yes, when and what did the pharmacy advise?)  Preferred Pharmacy (with phone number or street name):  [facility] [place], [place] - [place] [address]  [address] [place] [address]  Phone: [phone] Fax: [phone]  Hours: Not open 24 hours     Agent: Please be advised that RX refills may take up to 3 business days. We ask that you follow-up with your pharmacy.

## 2020-02-15 MED FILL — HYDROCHLOROTHIAZIDE 25 MG T: 25 | 30 days supply | Qty: 30 | Fill #1

## 2020-03-28 ENCOUNTER — Other Ambulatory Visit: Payer: Self-pay

## 2020-03-28 ENCOUNTER — Encounter: Payer: Self-pay | Admitting: Nurse Practitioner

## 2020-03-28 ENCOUNTER — Other Ambulatory Visit: Payer: Self-pay | Admitting: Nurse Practitioner

## 2020-03-28 ENCOUNTER — Ambulatory Visit: Payer: Medicare Other | Attending: Nurse Practitioner | Admitting: Nurse Practitioner

## 2020-03-28 DIAGNOSIS — D649 Anemia, unspecified: Secondary | ICD-10-CM | POA: Diagnosis not present

## 2020-03-28 DIAGNOSIS — R7309 Other abnormal glucose: Secondary | ICD-10-CM

## 2020-03-28 DIAGNOSIS — I10 Essential (primary) hypertension: Secondary | ICD-10-CM

## 2020-03-28 DIAGNOSIS — E785 Hyperlipidemia, unspecified: Secondary | ICD-10-CM

## 2020-03-28 MED ORDER — HYDROCHLOROTHIAZIDE 25 MG PO TABS: 25.0000 mg | ORAL_TABLET | Freq: Every day | ORAL | 0 refills | Status: DC

## 2020-03-28 MED ORDER — AMLODIPINE BESYLATE 5 MG PO TABS: 5.0000 mg | ORAL_TABLET | Freq: Every day | ORAL | 0 refills | Status: DC

## 2020-03-28 MED FILL — HYDROCHLOROTHIAZIDE 25 MG T: 25 | 90 days supply | Qty: 90 | Fill #0

## 2020-03-28 MED FILL — AMLODIPINE BESYLATE 5 MG TA: 5 | 90 days supply | Qty: 90 | Fill #0

## 2020-03-28 NOTE — Progress Notes (Signed)
Virtual Visit via Telephone Note Due to national recommendations of social distancing due to [place] 19, telehealth visit is felt to be most appropriate for this patient at this time.  I discussed the limitations, risks, security and privacy concerns of performing an evaluation and management service by telephone and the availability of in person appointments. I also discussed with the patient that there may be a patient responsible charge related to this service. The patient expressed understanding and agreed to proceed.    I connected with Ashley Pena on 03/28/20  at   3:30 PM EST  EDT by telephone and verified that I am speaking with the correct person using two identifiers.   Consent I discussed the limitations, risks, security and privacy concerns of performing an evaluation and management service by telephone and the availability of in person appointments. I also discussed with the patient that there may be a patient responsible charge related to this service. The patient expressed understanding and agreed to proceed.   Location of Patient: Private Residence   Location of Provider: [place] [facility] and [employer] Office    Persons participating in Telemedicine visit: Ashley Rankins FNP-BC [place]  Daughter Ashley Pena is interpreting:    History of Present Illness: Telemedicine visit for: Follow up   Blood pressure is not well controlled. She is currently taking HCTZ 25 mg daily. Will add amlodipine 5 mg daily. Denies chest pain, shortness of breath, palpitations, lightheadedness, dizziness, headaches or BLE edema. She does not monitor her blood pressure at home. BP Readings from Last 3 Encounters:  10/08/19 (!) 144/86  10/05/19 (!) 166/75  08/28/19 (!) 148/87      Past Medical History:  Diagnosis Date  . Hypertension     Past Surgical History:  Procedure Laterality Date  . CESAREAN SECTION    . CYSTOSCOPY N/A 08/28/2019   Procedure: CYSTOSCOPY;   Surgeon: Aletha Halim, MD;  Location: [facility];  Service: Gynecology;  Laterality: N/A;  . LEEP  01/17/2019  . TOTAL LAPAROSCOPIC HYSTERECTOMY WITH SALPINGECTOMY Bilateral 08/28/2019   Procedure: TOTAL LAPAROSCOPIC HYSTERECTOMY WITH SALPINGECTOMY;  Surgeon: Aletha Halim, MD;  Location: [place];  Service: Gynecology;  Laterality: Bilateral;    Family History  Problem Relation Age of Onset  . Hypertension Mother     Social History   Socioeconomic History  . Marital status: Single    Spouse name: Not on file  . Number of children: Not on file  . Years of education: Not on file  . Highest education level: Not on file  Occupational History  . Not on file  Tobacco Use  . Smoking status: Never Smoker  . Smokeless tobacco: Never Used  Vaping Use  . Vaping Use: Former  Substance and Sexual Activity  . Alcohol use: Pena  . Drug use: Pena  . Sexual activity: Not Currently  Other Topics Concern  . Not on file  Social History Narrative  . Not on file   Social Determinants of Health   Financial Resource Strain: Not on file  Food Insecurity: Pena Food Insecurity  . Worried About Charity fundraiser in the Last Year: Never true  . Ran Out of Food in the Last Year: Never true  Transportation Needs: Pena Transportation Needs  . Lack of Transportation (Medical): Pena  . Lack of Transportation (Non-Medical): Pena  Physical Activity: Not on file  Stress: Not on file  Social Connections: Not on file     Observations/Objective: Awake, alert and oriented x  3   Review of Systems  Constitutional: Negative for fever, malaise/fatigue and weight loss.  HENT: Negative.  Negative for nosebleeds.   Eyes: Negative.  Negative for blurred vision, double vision and photophobia.  Respiratory: Negative.  Negative for cough and shortness of breath.   Cardiovascular: Negative.  Negative for chest pain, palpitations and leg swelling.  Gastrointestinal: Negative.  Negative for heartburn, nausea and vomiting.   Musculoskeletal: Negative.  Negative for myalgias.  Neurological: Negative.  Negative for dizziness, focal weakness, seizures and headaches.  Psychiatric/Behavioral: Negative.  Negative for suicidal ideas.    Assessment and Plan: [name] was seen today for hypertension.  Diagnoses and all orders for this visit:  Essential hypertension -     amLODipine (NORVASC) 5 MG tablet; Take 1 tablet (5 mg total) by mouth daily. Please fill as a 90 day supply -     hydrochlorothiazide (HYDRODIURIL) 25 MG tablet; Take 1 tablet (25 mg total) by mouth daily. Continue all antihypertensives as prescribed.  Remember to bring in your blood pressure log with you for your follow up appointment.  DASH/Mediterranean Diets are healthier choices for HTN.    Dyslipidemia, goal LDL below 100 -     Lipid panel; Future INSTRUCTIONS: Work on a low fat, heart healthy diet and participate in regular aerobic exercise program by working out at least 150 minutes per week; 5 days a week-30 minutes per day. Avoid red meat/beef/steak,  fried foods. junk foods, sodas, sugary drinks, unhealthy snacking, alcohol and smoking.  Drink at least 80 oz of water per day and monitor your carbohydrate intake daily.    Elevated glucose -     Hemoglobin A1c; Future -     CMP14+EGFR; Future  Low hemoglobin -     CBC; Future     Follow Up Instructions Return in about 3 months (around 06/28/2020).     I discussed the assessment and treatment plan with the patient. The patient was provided an opportunity to ask questions and all were answered. The patient agreed with the plan and demonstrated an understanding of the instructions.   The patient was advised to call back or seek an in-person evaluation if the symptoms worsen or if the condition fails to improve as anticipated.  I provided 12 minutes of non-face-to-face time during this encounter including median intraservice time, reviewing previous notes, labs, imaging, medications and  explaining diagnosis and management.  Gildardo Pounds, FNP-BC

## 2020-05-21 ENCOUNTER — Other Ambulatory Visit: Payer: Self-pay

## 2020-05-21 ENCOUNTER — Other Ambulatory Visit: Payer: Self-pay | Admitting: Nurse Practitioner

## 2020-05-21 DIAGNOSIS — I10 Essential (primary) hypertension: Secondary | ICD-10-CM

## 2020-05-21 MED FILL — Hydrochlorothiazide Tab 25 MG: ORAL | 90 days supply | Qty: 90 | Fill #0 | Status: CN

## 2020-05-21 NOTE — Telephone Encounter (Signed)
Pharmacy has script on file and will get ready for patient. 

## 2020-05-21 NOTE — Telephone Encounter (Signed)
Medication Refill - Medication: hydrochlorothiazide (HYDRODIURIL) 25 MG tablet     Preferred Pharmacy (with phone number or street name):  [facility] Phone:  [phone]  Fax:  [phone]       Agent: Please be advised that RX refills may take up to 3 business days. We ask that you follow-up with your pharmacy.

## 2020-05-28 ENCOUNTER — Other Ambulatory Visit: Payer: Self-pay

## 2020-05-29 ENCOUNTER — Other Ambulatory Visit: Payer: Self-pay

## 2020-05-29 MED FILL — Amlodipine Besylate Tab 5 MG (Base Equivalent): ORAL | 90 days supply | Qty: 90 | Fill #0 | Status: AC

## 2020-05-29 MED FILL — Hydrochlorothiazide Tab 25 MG: ORAL | 90 days supply | Qty: 90 | Fill #0 | Status: AC

## 2020-09-25 ENCOUNTER — Other Ambulatory Visit (HOSPITAL_COMMUNITY)
Admission: RE | Admit: 2020-09-25 | Discharge: 2020-09-25 | Disposition: A | Discharge status: Home / Self Care | Payer: Medicare Other | Source: Ambulatory Visit | Attending: Obstetrics and Gynecology | Admitting: Obstetrics and Gynecology

## 2020-09-25 ENCOUNTER — Ambulatory Visit (INDEPENDENT_AMBULATORY_CARE_PROVIDER_SITE_OTHER): Payer: Medicare Other | Admitting: Obstetrics and Gynecology

## 2020-09-25 ENCOUNTER — Other Ambulatory Visit: Payer: Self-pay

## 2020-09-25 VITALS — BP 127/90 | HR 79 | Wt 121.3 lb

## 2020-09-25 DIAGNOSIS — Z87410 Personal history of cervical dysplasia: Secondary | ICD-10-CM | POA: Insufficient documentation

## 2020-09-25 DIAGNOSIS — Z1151 Encounter for screening for human papillomavirus (HPV): Secondary | ICD-10-CM | POA: Diagnosis not present

## 2020-09-25 DIAGNOSIS — R87610 Atypical squamous cells of undetermined significance on cytologic smear of cervix (ASC-US): Secondary | ICD-10-CM | POA: Insufficient documentation

## 2020-09-25 DIAGNOSIS — Z01419 Encounter for gynecological examination (general) (routine) without abnormal findings: Secondary | ICD-10-CM | POA: Insufficient documentation

## 2020-09-25 DIAGNOSIS — Z124 Encounter for screening for malignant neoplasm of cervix: Secondary | ICD-10-CM

## 2020-09-25 NOTE — Progress Notes (Signed)
Obstetrics and Gynecology Visit Return Patient Evaluation  Appointment Date: 09/25/2020  Primary Care Provider: Claiborne Rigg  [facility]  Chief Complaint: pap smear surveillance  History of Present Illness:  Ashley Pena is a 65 y.o. here for pap smear s/p 08/28/2019 TLH/BSO for CIN 3 with +margins on prior LEEP. Final pathology from TLH was CIN 3+ but negative margins  Interval History: Since that time, she states that she is doing well and no VB or spotting. Patient is here with her daughter.   Review of Systems: as noted in the History of Present Illness.  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 Current Outpatient Medications  Medication Sig Dispense Refill   amLODipine (NORVASC) 5 MG tablet TAKE 1 TABLET (5 MG TOTAL) BY MOUTH DAILY. 90 tablet 0   hydrochlorothiazide (HYDRODIURIL) 25 MG tablet TAKE 1 TABLET (25 MG TOTAL) BY MOUTH DAILY. 90 tablet 0   No current facility-administered medications for this visit.    Allergies: has No Known Allergies.  Physical Exam:  BP 127/90   Pulse 79   Wt 121 lb 4.8 oz (55 kg)   BMI 22.19 kg/m  Body mass index is 22.19 kg/m. General appearance: Well nourished, well developed female in no acute distress.  Abdomen: diffusely non tender to palpation, non distended, and no masses, hernias Neuro/Psych:  Normal mood and affect.    Pelvic exam:  EGBUS, vaginal vault: normal, moderate atrophy Cuff: normal, nttp, moderate atrophy   Assessment: pt doing well  Plan:  1. History of cervical dysplasia Vaginal pap  and hpv testing today - Cytology - PAP( [place])   RTC: 1 year and PRN  Cornelia Copa MD Attending [facility] [employer] [facility])

## 2020-10-01 LAB — CYTOLOGY - PAP
Comment: NEGATIVE
Diagnosis: UNDETERMINED — AB
High risk HPV: NEGATIVE

## 2021-01-20 ENCOUNTER — Other Ambulatory Visit: Payer: Self-pay | Admitting: Nurse Practitioner

## 2021-01-20 ENCOUNTER — Ambulatory Visit: Payer: Self-pay | Admitting: *Deleted

## 2021-01-20 DIAGNOSIS — I10 Essential (primary) hypertension: Secondary | ICD-10-CM

## 2021-01-20 NOTE — Telephone Encounter (Signed)
Medication Refill - Medication: hydrochlorothiazide (HYDRODIURIL) 25 MG tablet   Has the patient contacted their pharmacy? Yes.   (Agent: If no, request that the patient contact the pharmacy for the refill. If patient does not wish to contact the pharmacy document the reason why and proceed with request.) (Agent: If yes, when and what did the pharmacy advise?)  Preferred Pharmacy (with phone number or street name):  [place] [facility] [address] [place] [address]  Phone: [phone] Fax: [phone]   Has the patient been seen for an appointment in the last year OR does the patient have an upcoming appointment? Yes.    Agent: Please be advised that RX refills may take up to 3 business days. We ask that you follow-up with your pharmacy.

## 2021-01-20 NOTE — Telephone Encounter (Signed)
°  Chief Complaint: "heart skipping, palpitations" Symptoms: Constant, onset today Frequency: Constant, today Pertinent Negatives: Patient denies  Disposition: [x] ED /[] Urgent Care (no appt availability in office) / [] Appointment(In office/virtual)/ []  [facility]/ [] Home Care/ [] Refused Recommended Disposition /[] [facility]/ []  Follow-up with PCP Additional Notes: States HR "Fast and skipping all day" Daughter on phone as well. Verbalizes understanding. Reason for Disposition  [1] Skipped or extra beat(s) AND [2] occurs 4 or more times per minute  Answer Assessment - Initial Assessment Questions 1. DESCRIPTION: "Please describe your heart rate or heartbeat that you are having" (e.g., fast/slow, regular/irregular, skipped or extra beats, "palpitations")     Palpitations 2. ONSET: "When did it start?" (Minutes, hours or days)      Today 3. DURATION: "How long does it last" (e.g., seconds, minutes, hours)     Constant 4. PATTERN "Does it come and go, or has it been constant since it started?"  "Does it get worse with exertion?"   "Are you feeling it now?"     Constant 5. TAP: "Using your hand, can you tap out what you are feeling on a chair or table in front of you, so that I can hear?" (Note: not all patients can do this)        6. HEART RATE: "Can you tell me your heart rate?" "How many beats in 15 seconds?"  (Note: not all patients can do this)        7. RECURRENT SYMPTOM: "Have you ever had this before?" If Yes, ask: "When was the last time?" and "What happened that time?"       8. CAUSE: "What do you think is causing the palpitations?"      9. CARDIAC HISTORY: "Do you have any history of heart disease?" (e.g., heart attack, angina, bypass surgery, angioplasty, arrhythmia)       10. OTHER SYMPTOMS: "Do you have any other symptoms?" (e.g., dizziness, chest pain, sweating, difficulty breathing)       Yes  Protocols used: Heart Rate and Heartbeat  Questions-A-AH

## 2021-01-21 NOTE — Telephone Encounter (Signed)
Requested medications are due for refill today.  yes  Requested medications are on the active medications list.  yes  Last refill. 03/28/2020  Future visit scheduled.   no  Notes to clinic.  Labs are expired. Pt last seen 03/28/2020. Pt is more than 3 months over due for office visit.    Requested Prescriptions  Pending Prescriptions Disp Refills   hydrochlorothiazide (HYDRODIURIL) 25 MG tablet 90 tablet 0    Sig: TAKE 1 TABLET (25 MG TOTAL) BY MOUTH DAILY.     Cardiovascular: Diuretics - Thiazide Failed - 01/20/2021  7:06 PM      Failed - Ca in normal range and within 360 days    Calcium  Date Value Ref Range Status  08/24/2019 9.3 8.9 - 10.3 mg/dL Final          Failed - Cr in normal range and within 360 days    Creat  Date Value Ref Range Status  10/20/2015 0.78 0.50 - 0.99 mg/dL Final    Comment:      For patients > or = 68 years of age: The upper reference limit for Creatinine is approximately 13% higher for people identified as African-American.      Creatinine, Ser  Date Value Ref Range Status  08/24/2019 0.75 0.44 - 1.00 mg/dL Final          Failed - K in normal range and within 360 days    Potassium  Date Value Ref Range Status  08/24/2019 3.6 3.5 - 5.1 mmol/L Final          Failed - Na in normal range and within 360 days    Sodium  Date Value Ref Range Status  08/24/2019 141 135 - 145 mmol/L Final  03/28/2018 144 134 - 144 mmol/L Final          Failed - Last BP in normal range    BP Readings from Last 1 Encounters:  09/25/20 127/90          Failed - Valid encounter within last 6 months    Recent Outpatient Visits           9 months ago Essential hypertension   [place], Vernia Buff, NP   1 year ago Essential hypertension   [place], [place], MD   2 years ago Essential hypertension   [place], Vernia Buff, NP   2 years ago  Essential hypertension   [place], Vernia Buff, NP   3 years ago Essential hypertension   [place], Vernia Buff, NP

## 2021-03-12 ENCOUNTER — Other Ambulatory Visit: Payer: Self-pay

## 2021-03-12 ENCOUNTER — Telehealth: Payer: Self-pay | Admitting: Nurse Practitioner

## 2021-03-12 ENCOUNTER — Other Ambulatory Visit: Payer: Self-pay | Admitting: Nurse Practitioner

## 2021-03-12 DIAGNOSIS — I10 Essential (primary) hypertension: Secondary | ICD-10-CM

## 2021-03-12 MED ORDER — AMLODIPINE BESYLATE 5 MG PO TABS
ORAL_TABLET | Freq: Every day | ORAL | 0 refills | Status: DC
  Filled 2021-03-12 – 2021-03-16 (×3): qty 30, 30d supply, fill #0

## 2021-03-12 NOTE — Telephone Encounter (Signed)
Requested medications are due for refill today.  yes  Requested medications are on the active medications list.  yes  Last refill. 03/28/2020  #90 0 refills  Future visit scheduled.   yes  Notes to clinic.  Pt is more than 3 months overdue for OV.    Requested Prescriptions  Pending Prescriptions Disp Refills   hydrochlorothiazide (HYDRODIURIL) 25 MG tablet 90 tablet 0    Sig: TAKE 1 TABLET (25 MG TOTAL) BY MOUTH DAILY.     Cardiovascular: Diuretics - Thiazide Failed - 03/12/2021 11:14 AM      Failed - Cr in normal range and within 180 days    Creat  Date Value Ref Range Status  10/20/2015 0.78 0.50 - 0.99 mg/dL Final    Comment:      For patients > or = 68 years of age: The upper reference limit for Creatinine is approximately 13% higher for people identified as African-American.      Creatinine, Ser  Date Value Ref Range Status  08/24/2019 0.75 0.44 - 1.00 mg/dL Final          Failed - K in normal range and within 180 days    Potassium  Date Value Ref Range Status  08/24/2019 3.6 3.5 - 5.1 mmol/L Final          Failed - Na in normal range and within 180 days    Sodium  Date Value Ref Range Status  08/24/2019 141 135 - 145 mmol/L Final  03/28/2018 144 134 - 144 mmol/L Final          Failed - Last BP in normal range    BP Readings from Last 1 Encounters:  09/25/20 127/90          Failed - Valid encounter within last 6 months    Recent Outpatient Visits           11 months ago Essential hypertension   [facility] [place], Shea Stakes, NP   1 year ago Essential hypertension   [facility] [place], [place], MD   2 years ago Essential hypertension   [facility] [place], Shea Stakes, NP   2 years ago Essential hypertension   [facility] [place], Shea Stakes, NP   3 years ago Essential hypertension   [facility] [facility] [place],  [place], NP               amLODipine (NORVASC) 5 MG tablet 90 tablet 0    Sig: TAKE 1 TABLET (5 MG TOTAL) BY MOUTH DAILY.     Cardiovascular: Calcium Channel Blockers 2 Failed - 03/12/2021 11:14 AM      Failed - Last BP in normal range    BP Readings from Last 1 Encounters:  09/25/20 127/90          Failed - Valid encounter within last 6 months    Recent Outpatient Visits           11 months ago Essential hypertension   [facility] [place], Shea Stakes, NP   1 year ago Essential hypertension   [facility] [place], [place], MD   2 years ago Essential hypertension   [facility] [place], Shea Stakes, NP   2 years ago Essential hypertension   [facility] [place], Shea Stakes, NP   3  years ago Essential hypertension   [facility] [place], [place], NP              Passed - Last Heart Rate in normal range    Pulse Readings from Last 1 Encounters:  09/25/20 79

## 2021-03-12 NOTE — Telephone Encounter (Signed)
Advised daughter to contact the pharmacy.

## 2021-03-12 NOTE — Telephone Encounter (Signed)
Pt's daughter called in states, there is no one to pick up her medicine, so she is asking if it can be mailed. Please call back

## 2021-03-13 ENCOUNTER — Other Ambulatory Visit (HOSPITAL_COMMUNITY): Payer: Self-pay

## 2021-03-13 ENCOUNTER — Other Ambulatory Visit: Payer: Self-pay

## 2021-03-13 ENCOUNTER — Other Ambulatory Visit: Payer: Self-pay | Admitting: Nurse Practitioner

## 2021-03-13 DIAGNOSIS — I10 Essential (primary) hypertension: Secondary | ICD-10-CM

## 2021-03-13 MED ORDER — HYDROCHLOROTHIAZIDE 25 MG PO TABS
ORAL_TABLET | Freq: Every day | ORAL | 0 refills | Status: DC
  Filled 2021-03-13 – 2021-03-16 (×3): qty 30, 30d supply, fill #0

## 2021-03-13 NOTE — Telephone Encounter (Signed)
Requested medication (s) are due for refill today - yes  Requested medication (s) are on the active medication list -yes  Future visit scheduled -yes  Last refill: 03/28/20 #90  Notes to clinic: Request RF: fails lab protocol  Requested Prescriptions  Pending Prescriptions Disp Refills   hydrochlorothiazide (HYDRODIURIL) 25 MG tablet 90 tablet 0    Sig: TAKE 1 TABLET (25 MG TOTAL) BY MOUTH DAILY.     Cardiovascular: Diuretics - Thiazide Failed - 03/13/2021  3:01 PM      Failed - Cr in normal range and within 180 days    Creat  Date Value Ref Range Status  10/20/2015 0.78 0.50 - 0.99 mg/dL Final    Comment:      For patients > or = 68 years of age: The upper reference limit for Creatinine is approximately 13% higher for people identified as African-American.      Creatinine, Ser  Date Value Ref Range Status  08/24/2019 0.75 0.44 - 1.00 mg/dL Final          Failed - K in normal range and within 180 days    Potassium  Date Value Ref Range Status  08/24/2019 3.6 3.5 - 5.1 mmol/L Final          Failed - Na in normal range and within 180 days    Sodium  Date Value Ref Range Status  08/24/2019 141 135 - 145 mmol/L Final  03/28/2018 144 134 - 144 mmol/L Final          Failed - Last BP in normal range    BP Readings from Last 1 Encounters:  09/25/20 127/90          Failed - Valid encounter within last 6 months    Recent Outpatient Visits           11 months ago Essential hypertension   [facility] [place], Shea Stakes, NP   1 year ago Essential hypertension   [facility] [place], [place], MD   2 years ago Essential hypertension   [facility] [place], Shea Stakes, NP   2 years ago Essential hypertension   [facility] [place], Shea Stakes, NP   3 years ago Essential hypertension   [facility] [place], Shea Stakes, NP                  Requested Prescriptions  Pending Prescriptions Disp Refills   hydrochlorothiazide (HYDRODIURIL) 25 MG tablet 90 tablet 0    Sig: TAKE 1 TABLET (25 MG TOTAL) BY MOUTH DAILY.     Cardiovascular: Diuretics - Thiazide Failed - 03/13/2021  3:01 PM      Failed - Cr in normal range and within 180 days    Creat  Date Value Ref Range Status  10/20/2015 0.78 0.50 - 0.99 mg/dL Final    Comment:      For patients > or = 68 years of age: The upper reference limit for Creatinine is approximately 13% higher for people identified as African-American.      Creatinine, Ser  Date Value Ref Range Status  08/24/2019 0.75 0.44 - 1.00 mg/dL Final          Failed - K in normal range and within 180 days    Potassium  Date Value Ref Range Status  08/24/2019 3.6 3.5 - 5.1 mmol/L Final  Failed - Na in normal range and within 180 days    Sodium  Date Value Ref Range Status  08/24/2019 141 135 - 145 mmol/L Final  03/28/2018 144 134 - 144 mmol/L Final          Failed - Last BP in normal range    BP Readings from Last 1 Encounters:  09/25/20 127/90          Failed - Valid encounter within last 6 months    Recent Outpatient Visits           11 months ago Essential hypertension   [facility] [place], Shea Stakes, NP   1 year ago Essential hypertension   [facility] [place], [place], MD   2 years ago Essential hypertension   [facility] [place], Shea Stakes, NP   2 years ago Essential hypertension   [facility] [place], Shea Stakes, NP   3 years ago Essential hypertension   [facility] [place], Shea Stakes, NP

## 2021-03-16 ENCOUNTER — Other Ambulatory Visit (HOSPITAL_COMMUNITY): Payer: Self-pay

## 2021-03-16 ENCOUNTER — Other Ambulatory Visit: Payer: Self-pay

## 2021-03-17 ENCOUNTER — Other Ambulatory Visit: Payer: Self-pay

## 2021-03-17 ENCOUNTER — Other Ambulatory Visit (HOSPITAL_COMMUNITY): Payer: Self-pay

## 2021-04-08 ENCOUNTER — Other Ambulatory Visit: Payer: Self-pay

## 2021-04-22 ENCOUNTER — Encounter: Payer: Self-pay | Admitting: Nurse Practitioner

## 2021-04-22 ENCOUNTER — Ambulatory Visit: Payer: Medicare Other | Attending: Nurse Practitioner | Admitting: Nurse Practitioner

## 2021-04-22 ENCOUNTER — Other Ambulatory Visit: Payer: Self-pay

## 2021-04-22 VITALS — BP 150/84 | HR 83 | Temp 98.5°F | Resp 12 | Ht 59.5 in | Wt 125.0 lb

## 2021-04-22 DIAGNOSIS — E785 Hyperlipidemia, unspecified: Secondary | ICD-10-CM | POA: Diagnosis not present

## 2021-04-22 DIAGNOSIS — I10 Essential (primary) hypertension: Secondary | ICD-10-CM | POA: Diagnosis not present

## 2021-04-22 DIAGNOSIS — Z0000 Encounter for general adult medical examination without abnormal findings: Secondary | ICD-10-CM | POA: Diagnosis not present

## 2021-04-22 DIAGNOSIS — Z0100 Encounter for examination of eyes and vision without abnormal findings: Secondary | ICD-10-CM

## 2021-04-22 DIAGNOSIS — Z780 Asymptomatic menopausal state: Secondary | ICD-10-CM

## 2021-04-22 DIAGNOSIS — Z13820 Encounter for screening for osteoporosis: Secondary | ICD-10-CM

## 2021-04-22 DIAGNOSIS — Z1211 Encounter for screening for malignant neoplasm of colon: Secondary | ICD-10-CM

## 2021-04-22 DIAGNOSIS — Z1231 Encounter for screening mammogram for malignant neoplasm of breast: Secondary | ICD-10-CM

## 2021-04-22 DIAGNOSIS — D649 Anemia, unspecified: Secondary | ICD-10-CM

## 2021-04-22 DIAGNOSIS — K219 Gastro-esophageal reflux disease without esophagitis: Secondary | ICD-10-CM

## 2021-04-22 MED ORDER — FAMOTIDINE 20 MG PO TABS
20.0000 mg | ORAL_TABLET | Freq: Two times a day (BID) | ORAL | 1 refills | Status: DC
  Filled 2021-04-22: qty 60, 30d supply, fill #0
  Filled 2021-06-23: qty 60, 30d supply, fill #1
  Filled 2021-06-24 (×3): qty 60, 30d supply, fill #0

## 2021-04-22 NOTE — Progress Notes (Signed)
? ?Assessment & Plan:  ?Ashley Pena was seen today for annual exam and headache. ? ?Diagnoses and all orders for this visit: ? ?Encounter for annual physical exam ? ?Essential hypertension ?-     CMP14+EGFR ? ?Dyslipidemia, goal LDL below 100 ?-     Lipid panel ? ?Encounter for complete eye exam ?-     Ambulatory referral to Ophthalmology ? ?Breast cancer screening by mammogram ?-     MS DIGITAL SCREENING TOMO BILATERAL; Future ? ?Encounter for osteoporosis screening in asymptomatic postmenopausal patient ?-     DG Bone Density; Future ? ?Colon cancer screening ?-     Ambulatory referral to Gastroenterology ? ?Low hemoglobin ?-     CBC with Differential ? ?Gastroesophageal reflux disease without esophagitis ?-     famotidine (PEPCID) 20 MG tablet; Take 1 tablet (20 mg total) by mouth 2 (two) times daily. ?INSTRUCTIONS: Avoid GERD Triggers: acidic, spicy or fried foods, caffeine, coffee, sodas,  alcohol and chocolate.   ? ? ? ?Patient has been counseled on age-appropriate routine health concerns for screening and prevention. These are reviewed and up-to-date. Referrals have been placed accordingly. Immunizations are up-to-date or declined.    ?Subjective:  ? ?Chief Complaint  ?Patient presents with  ? Annual Exam  ? Headache  ? ?HPI ?Ashley Pena 66 y.o. female presents to office today for annual physical. ?She is accompanied by her friend today who is helping to translate.  ? ?She has not been taking her medications every day as prescribed. Blood pressure is elevated. I  increased her amlodipine to 10 mg from 21m and she will continue HCTZ 25 mg daily.  ?BP Readings from Last 3 Encounters:  ?04/23/21 (!) 150/84  ?09/25/20 127/90  ?10/08/19 (!) 144/86  ?  ? ?GERD: Paitent complains of heartburn. This has been associated with chest pain, heartburn, and midespigastric pain.  She denies difficulty swallowing, dysphagia, hematemesis, laryngitis, melena, need to clear throat frequently, shortness of breath, and upper abdominal  discomfort. Symptoms have been present for several months. She denies dysphagia.  She has not lost weight. She denies melena, hematochezia, hematemesis, and coffee ground emesis. Medical therapy in the past has included none.  ? ?Review of Systems  ?Constitutional:  Negative for fever, malaise/fatigue and weight loss.  ?HENT: Negative.  Negative for nosebleeds.   ?Eyes: Negative.  Negative for blurred vision, double vision and photophobia.  ?Respiratory: Negative.  Negative for cough and shortness of breath.   ?Cardiovascular: Negative.  Negative for chest pain, palpitations and leg swelling.  ?Gastrointestinal:  Positive for heartburn. Negative for nausea and vomiting.  ?Genitourinary: Negative.   ?Musculoskeletal: Negative.  Negative for myalgias.  ?Skin: Negative.   ?Neurological: Negative.  Negative for dizziness, focal weakness, seizures and headaches.  ?Endo/Heme/Allergies: Negative.   ?Psychiatric/Behavioral: Negative.  Negative for suicidal ideas.   ? ?Past Medical History:  ?Diagnosis Date  ? Hypertension   ? ? ?Past Surgical History:  ?Procedure Laterality Date  ? CESAREAN SECTION    ? CYSTOSCOPY N/A 08/28/2019  ? Procedure: CYSTOSCOPY;  Surgeon: PAletha Halim MD;  [facility]  Service: Gynecology;  Laterality: N/A;  ? LEEP  01/17/2019  ? TOTAL LAPAROSCOPIC HYSTERECTOMY WITH SALPINGECTOMY Bilateral 08/28/2019  ? Procedure: TOTAL LAPAROSCOPIC HYSTERECTOMY WITH SALPINGECTOMY;  Surgeon: PAletha Halim MD;  [facility]  Service: Gynecology;  Laterality: Bilateral;  ? ? ?Family History  ?Problem Relation Age of Onset  ? Hypertension Mother   ? ? ?Social History Reviewed with no changes to  be made today.  ? ?Outpatient Medications Prior to Visit  ?Medication Sig Dispense Refill  ? amLODipine (NORVASC) 5 MG tablet TAKE 1 TABLET (5 MG TOTAL) BY MOUTH DAILY. 30 tablet 0  ? hydrochlorothiazide (HYDRODIURIL) 25 MG tablet TAKE 1 TABLET  BY MOUTH DAILY. (Patient not taking: Reported on 04/22/2021) 30  tablet 0  ? ?No facility-administered medications prior to visit.  ? ? ?No Known Allergies ? ?   ?Objective:  ?  ?BP (!) 150/84 (BP Location: Left Arm, Cuff Size: Normal)   Pulse 83   Temp 98.5 ?F (36.9 ?C) (Oral)   Resp 12   Ht 4' 11.5" (1.511 m)   Wt 125 lb (56.7 kg)   SpO2 100%   BMI 24.82 kg/m?  ?Wt Readings from Last 3 Encounters:  ?04/22/21 125 lb (56.7 kg)  ?09/25/20 121 lb 4.8 oz (55 kg)  ?10/08/19 123 lb 4.8 oz (55.9 kg)  ? ? ?Physical Exam ?Constitutional:   ?   Appearance: She is well-developed.  ?HENT:  ?   Head: Normocephalic and atraumatic.  ?   Right Ear: Hearing, tympanic membrane, ear canal and external ear normal.  ?   Left Ear: Hearing, tympanic membrane, ear canal and external ear normal.  ?   Nose: Nose normal.  ?   Right Turbinates: Not enlarged.  ?   Left Turbinates: Not enlarged.  ?   Mouth/Throat:  ?   Lips: Pink.  ?   Mouth: Mucous membranes are moist.  ?   Dentition: No dental tenderness, gingival swelling, dental abscesses or gum lesions.  ?   Pharynx: No oropharyngeal exudate.  ?Eyes:  ?   General: No scleral icterus.    ?   Right eye: No discharge.  ?   Extraocular Movements: Extraocular movements intact.  ?   Conjunctiva/sclera: Conjunctivae normal.  ?   Pupils: Pupils are equal, round, and reactive to light.  ?Neck:  ?   Thyroid: No thyromegaly.  ?   Trachea: No tracheal deviation.  ?Cardiovascular:  ?   Rate and Rhythm: Normal rate and regular rhythm.  ?   Heart sounds: Normal heart sounds. No murmur heard. ?  No friction rub.  ?Pulmonary:  ?   Effort: Pulmonary effort is normal. No accessory muscle usage or respiratory distress.  ?   Breath sounds: Normal breath sounds. No decreased breath sounds, wheezing, rhonchi or rales.  ?Abdominal:  ?   General: Bowel sounds are normal. There is no distension.  ?   Palpations: Abdomen is soft. There is no mass.  ?   Tenderness: There is no abdominal tenderness. There is no right CVA tenderness, left CVA tenderness, guarding or rebound.   ?   Hernia: No hernia is present.  ?Musculoskeletal:     ?   General: No tenderness or deformity. Normal range of motion.  ?   Cervical back: Normal range of motion and neck supple.  ?Lymphadenopathy:  ?   Cervical: No cervical adenopathy.  ?Skin: ?   General: Skin is warm and dry.  ?   Findings: No erythema.  ?Neurological:  ?   Mental Status: She is alert and oriented to person, place, and time.  ?   Cranial Nerves: No cranial nerve deficit.  ?   Motor: Motor function is intact.  ?   Coordination: Coordination is intact. Coordination normal.  ?   Gait: Gait is intact.  ?   Deep Tendon Reflexes:  ?   Reflex Scores: ?  Patellar reflexes are 1+ on the right side and 1+ on the left side. ?Psychiatric:     ?   Attention and Perception: Attention normal.     ?   Mood and Affect: Mood normal.     ?   Speech: Speech normal.     ?   Behavior: Behavior normal.     ?   Thought Content: Thought content normal.     ?   Judgment: Judgment normal.  ? ? ? ? ?   ?Patient has been counseled extensively about nutrition and exercise as well as the importance of adherence with medications and regular follow-up. The patient was given clear instructions to go to ER or return to medical center if symptoms don't improve, worsen or new problems develop. The patient verbalized understanding.  ? ?Follow-up: Return in about 4 weeks (around 05/20/2021) for BP CHECK WITH [facility]KE. see me in 3 months.  ? ?Gildardo Pounds, FNP-BC ?[place] ?[place], [place] ?[phone]   ?04/23/2021, 8:27 PM ?

## 2021-04-22 NOTE — Progress Notes (Signed)
CPE ?Knee pain  ?7/10 worse with sitting and standing ?

## 2021-04-23 ENCOUNTER — Other Ambulatory Visit: Payer: Self-pay | Admitting: *Deleted

## 2021-04-23 ENCOUNTER — Telehealth: Payer: Self-pay | Admitting: Nurse Practitioner

## 2021-04-23 LAB — CBC WITH DIFFERENTIAL/PLATELET
Basophils Absolute: 0.1 10*3/uL (ref 0.0–0.2)
Basos: 1 %
EOS (ABSOLUTE): 0.1 10*3/uL (ref 0.0–0.4)
Eos: 1 %
Hematocrit: 38.4 % (ref 34.0–46.6)
Hemoglobin: 12.4 g/dL (ref 11.1–15.9)
Immature Grans (Abs): 0 10*3/uL (ref 0.0–0.1)
Immature Granulocytes: 0 %
Lymphocytes Absolute: 2.6 10*3/uL (ref 0.7–3.1)
Lymphs: 41 %
MCH: 27.4 pg (ref 26.6–33.0)
MCHC: 32.3 g/dL (ref 31.5–35.7)
MCV: 85 fL (ref 79–97)
Monocytes Absolute: 0.4 10*3/uL (ref 0.1–0.9)
Monocytes: 6 %
Neutrophils Absolute: 3.2 10*3/uL (ref 1.4–7.0)
Neutrophils: 51 %
Platelets: 388 10*3/uL (ref 150–450)
RBC: 4.53 x10E6/uL (ref 3.77–5.28)
RDW: 13.1 % (ref 11.7–15.4)
WBC: 6.2 10*3/uL (ref 3.4–10.8)

## 2021-04-23 LAB — LIPID PANEL
Chol/HDL Ratio: 3.5 ratio (ref 0.0–4.4)
Cholesterol, Total: 220 mg/dL — ABNORMAL HIGH (ref 100–199)
HDL: 62 mg/dL (ref >39)
LDL Chol Calc (NIH): 140 mg/dL — ABNORMAL HIGH (ref 0–99)
Triglycerides: 102 mg/dL (ref 0–149)
VLDL Cholesterol Cal: 18 mg/dL (ref 5–40)

## 2021-04-23 LAB — CMP14+EGFR
ALT: 17 IU/L (ref 0–32)
AST: 19 IU/L (ref 0–40)
Albumin/Globulin Ratio: 1.7 (ref 1.2–2.2)
Albumin: 4.8 g/dL (ref 3.8–4.8)
Alkaline Phosphatase: 88 IU/L (ref 44–121)
BUN/Creatinine Ratio: 11 — ABNORMAL LOW (ref 12–28)
BUN: 9 mg/dL (ref 8–27)
Bilirubin Total: 0.3 mg/dL (ref 0.0–1.2)
CO2: 21 mmol/L (ref 20–29)
Calcium: 9.8 mg/dL (ref 8.7–10.3)
Chloride: 104 mmol/L (ref 96–106)
Creatinine, Ser: 0.82 mg/dL (ref 0.57–1.00)
Globulin, Total: 2.9 g/dL (ref 1.5–4.5)
Glucose: 83 mg/dL (ref 70–99)
Potassium: 4 mmol/L (ref 3.5–5.2)
Sodium: 142 mmol/L (ref 134–144)
Total Protein: 7.7 g/dL (ref 6.0–8.5)
eGFR: 78 mL/min/{1.73_m2} (ref >59)

## 2021-04-23 MED ORDER — ATORVASTATIN CALCIUM 20 MG PO TABS
20.0000 mg | ORAL_TABLET | Freq: Every day | ORAL | 3 refills | Status: DC
  Filled 2021-04-23: qty 90, 90d supply, fill #0

## 2021-04-23 MED ORDER — HYDROCHLOROTHIAZIDE 25 MG PO TABS
25.0000 mg | ORAL_TABLET | Freq: Every day | ORAL | 1 refills | Status: DC
  Filled 2021-04-23: qty 90, 90d supply, fill #0
  Filled 2021-06-23: qty 90, 90d supply, fill #1

## 2021-04-23 MED ORDER — AMLODIPINE BESYLATE 10 MG PO TABS
10.0000 mg | ORAL_TABLET | Freq: Every day | ORAL | 1 refills | Status: DC
  Filled 2021-04-23: qty 90, 90d supply, fill #0
  Filled 2021-06-23: qty 90, 90d supply, fill #1

## 2021-04-23 NOTE — Telephone Encounter (Signed)
Left message on voicemail of Ashley Pena,  ?To provide office with a pharmacy- [facility], [place], that she would like the patient to go to.  ? ?With patient having Medicare, vaccine could not be administered in office per insurance.  ?

## 2021-04-23 NOTE — Telephone Encounter (Signed)
Copied from [place] [id number]. Topic: General - Inquiry ?>> Apr 22, 2021  5:06 PM [place] wrote: ?Pt was just in the office and states that dr suggested shingles and when went downstairs to get it but [facility] was out and told her to call dr to send a script so he can order it as they are out.? She states they have to do this way due to insurance so requesting script be sent downstairs. Questions [phone] ?

## 2021-04-24 ENCOUNTER — Other Ambulatory Visit: Payer: Self-pay

## 2021-05-21 ENCOUNTER — Telehealth: Payer: Self-pay | Admitting: Nurse Practitioner

## 2021-05-21 NOTE — Telephone Encounter (Signed)
This is just fine. It still gives [place] a little over a month before she sees her PCP. ?

## 2021-05-21 NOTE — Telephone Encounter (Signed)
Caregiver Marylee Floras called to reschedule pt's bp check.   ?However, first available is 1 month away. ?She scheduled this appointment and wants to know if OK to wait another month for a bp check ?

## 2021-05-22 ENCOUNTER — Ambulatory Visit: Payer: Medicare Other | Admitting: Pharmacist

## 2021-05-26 NOTE — Telephone Encounter (Signed)
Thank you. I will let her know.

## 2021-06-23 ENCOUNTER — Other Ambulatory Visit: Payer: Self-pay

## 2021-06-23 ENCOUNTER — Ambulatory Visit: Payer: Medicare Other | Admitting: Pharmacist

## 2021-06-23 ENCOUNTER — Ambulatory Visit: Payer: Medicare Other | Attending: Nurse Practitioner

## 2021-06-23 DIAGNOSIS — Z23 Encounter for immunization: Secondary | ICD-10-CM | POA: Diagnosis present

## 2021-06-23 NOTE — Progress Notes (Signed)
Pt arrived for shingles vaccine Vaccine was given in Right deltoid Pt tolerated injection well.

## 2021-06-24 ENCOUNTER — Other Ambulatory Visit (HOSPITAL_COMMUNITY): Payer: Self-pay

## 2021-06-24 ENCOUNTER — Other Ambulatory Visit: Payer: Self-pay

## 2021-07-07 ENCOUNTER — Encounter: Payer: Self-pay | Admitting: Nurse Practitioner

## 2021-07-07 IMAGING — MG DIGITAL SCREENING BILAT W/ TOMO
6 of 12 series · 6 of 36 positions shown · non-contrast
Comparison: Previous exam(s).

CLINICAL DATA: Screening.

EXAM:
DIGITAL SCREENING BILATERAL MAMMOGRAM WITH TOMO AND CAD

[R CC synth-2D (1 of 2)]
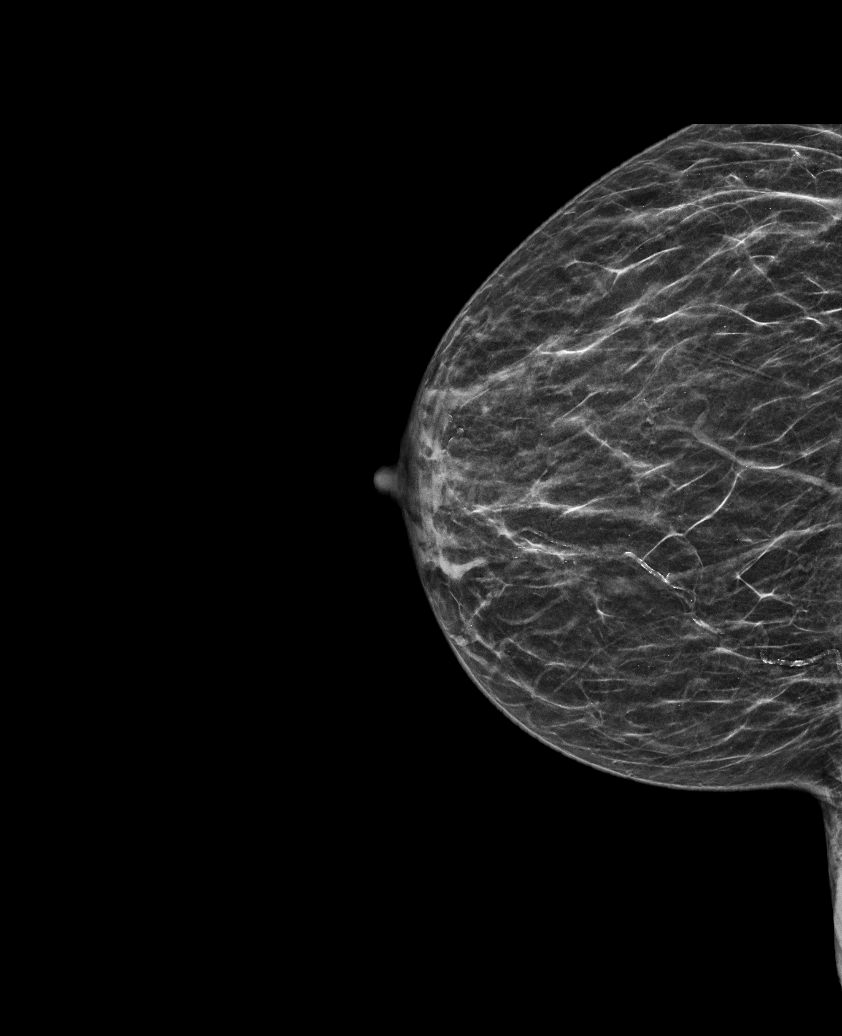

[R MLO synth-2D]
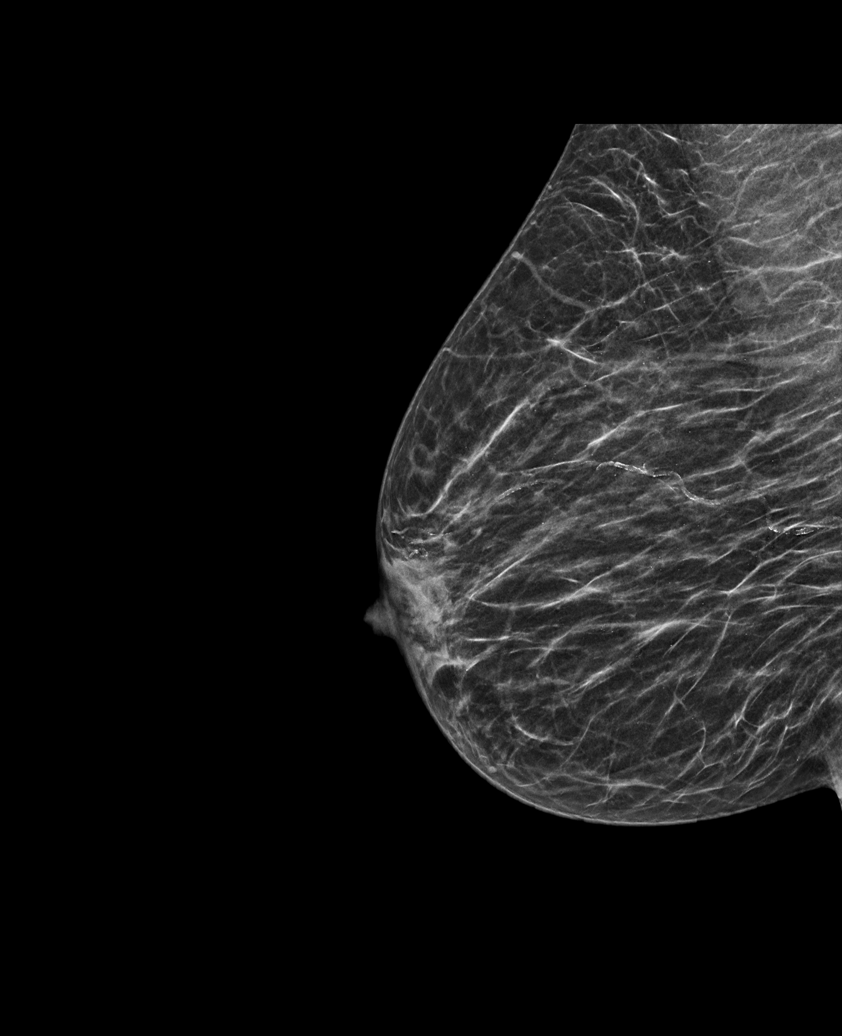

[L MLO synth-2D (1 of 2)]
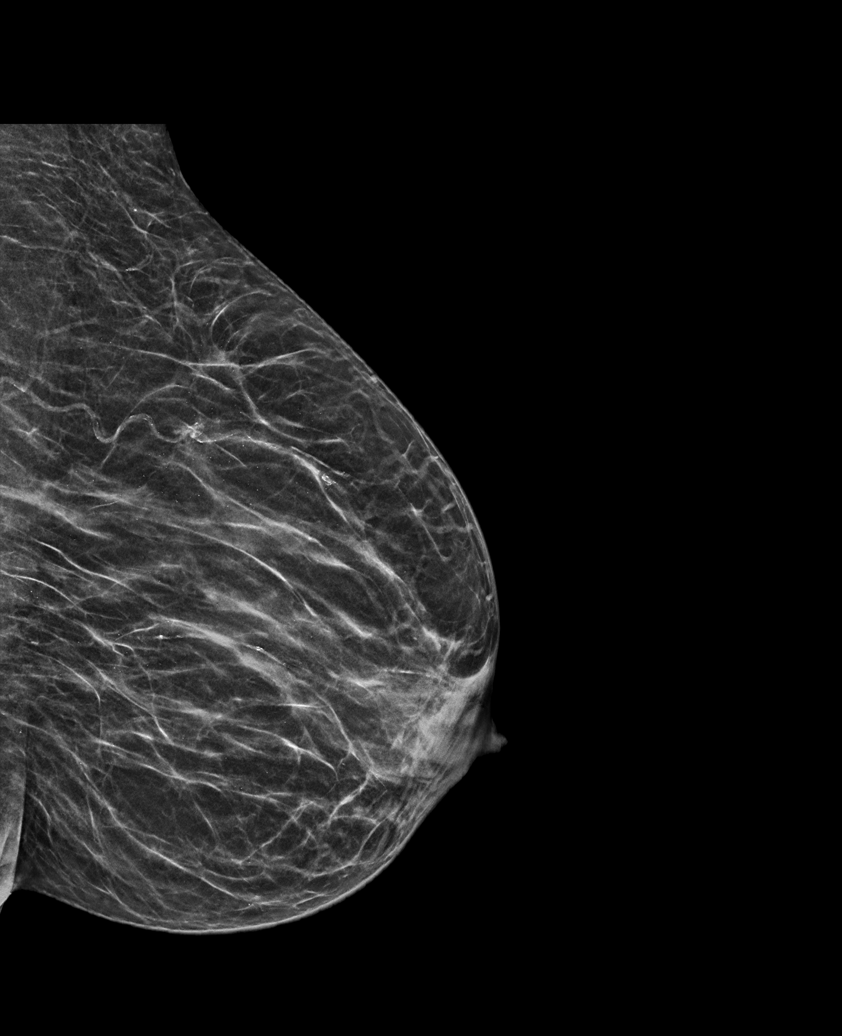

[L CC synth-2D]
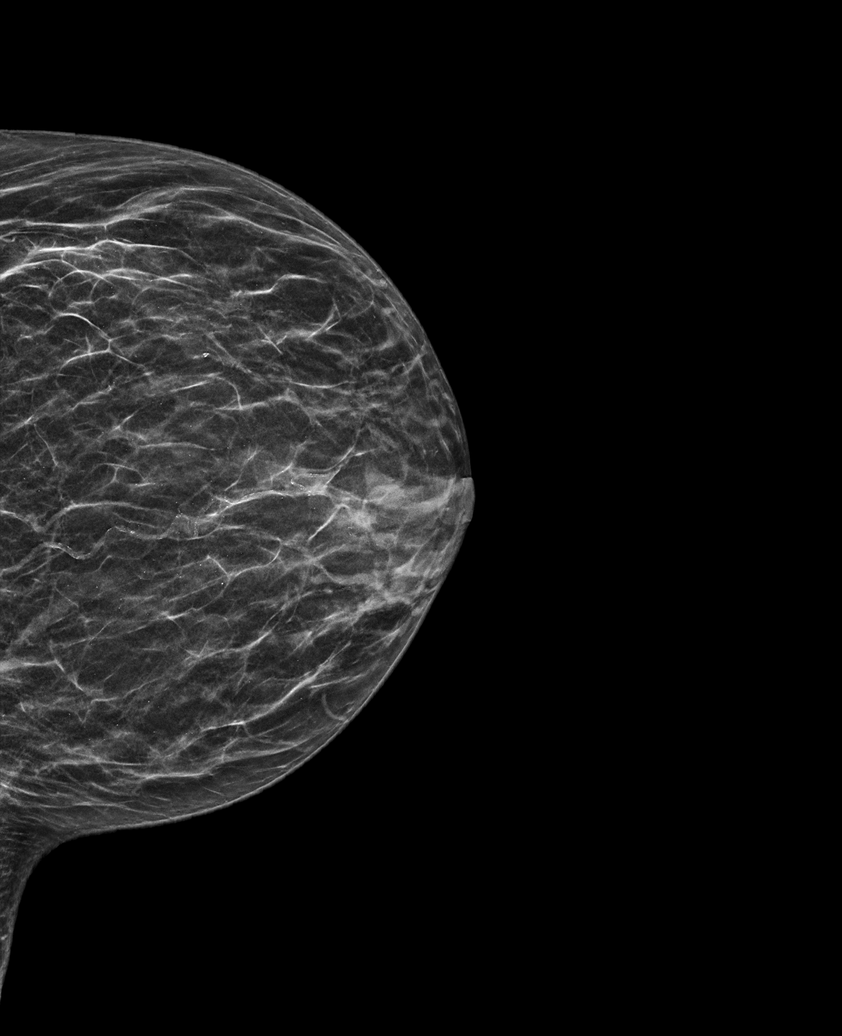

[R CC synth-2D (2 of 2)]
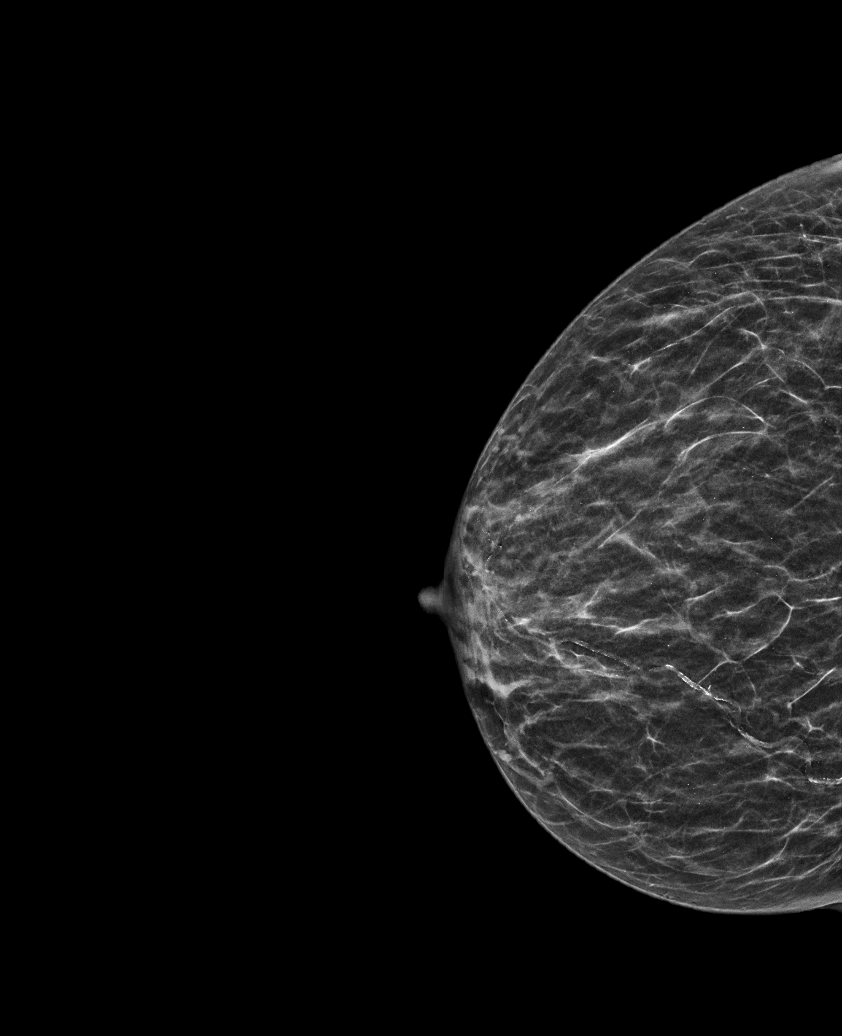

[L MLO synth-2D (2 of 2)]
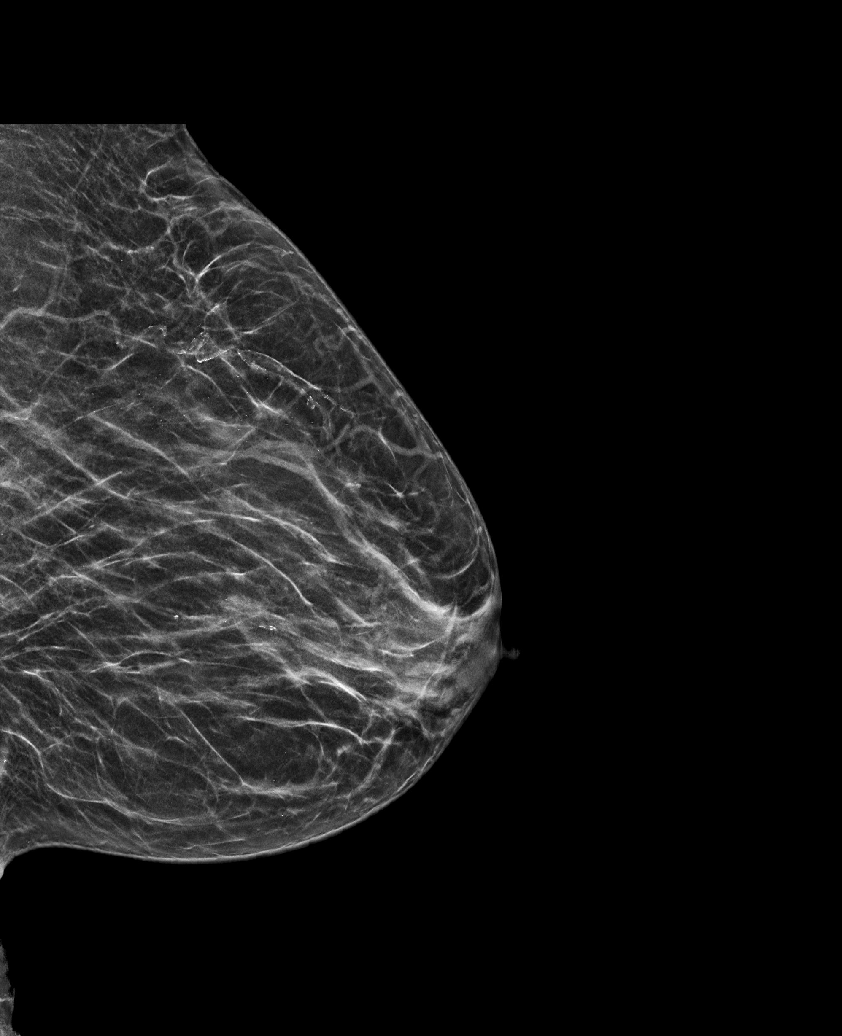

[6 of 36 positions shown; findings below may reference images not displayed]

ACR Breast Density Category b: There are scattered areas of
fibroglandular density.
FINDINGS: There are no findings suspicious for malignancy. Images were
processed with CAD.
IMPRESSION: No mammographic evidence of malignancy. A result letter of this
screening mammogram will be mailed directly to the patient.

RECOMMENDATION:
Screening mammogram in one year. (Code:[id number])

BI-RADS CATEGORY  1: Negative.

## 2021-07-08 ENCOUNTER — Other Ambulatory Visit: Payer: Self-pay | Admitting: Nurse Practitioner

## 2021-07-31 ENCOUNTER — Encounter: Payer: Self-pay | Admitting: Nurse Practitioner

## 2021-07-31 ENCOUNTER — Ambulatory Visit: Payer: Medicare Other | Attending: Nurse Practitioner | Admitting: Nurse Practitioner

## 2021-07-31 ENCOUNTER — Other Ambulatory Visit: Payer: Self-pay

## 2021-07-31 VITALS — BP 157/84 | HR 71 | Ht 59.0 in | Wt 126.2 lb

## 2021-07-31 DIAGNOSIS — Z713 Dietary counseling and surveillance: Secondary | ICD-10-CM | POA: Insufficient documentation

## 2021-07-31 DIAGNOSIS — I10 Essential (primary) hypertension: Secondary | ICD-10-CM | POA: Diagnosis present

## 2021-07-31 DIAGNOSIS — Z79899 Other long term (current) drug therapy: Secondary | ICD-10-CM | POA: Diagnosis not present

## 2021-07-31 DIAGNOSIS — F5101 Primary insomnia: Secondary | ICD-10-CM | POA: Diagnosis not present

## 2021-07-31 DIAGNOSIS — Z23 Encounter for immunization: Secondary | ICD-10-CM | POA: Diagnosis not present

## 2021-07-31 DIAGNOSIS — G8929 Other chronic pain: Secondary | ICD-10-CM | POA: Diagnosis not present

## 2021-07-31 DIAGNOSIS — Z0100 Encounter for examination of eyes and vision without abnormal findings: Secondary | ICD-10-CM

## 2021-07-31 DIAGNOSIS — Z1211 Encounter for screening for malignant neoplasm of colon: Secondary | ICD-10-CM | POA: Insufficient documentation

## 2021-07-31 DIAGNOSIS — M25561 Pain in right knee: Secondary | ICD-10-CM | POA: Insufficient documentation

## 2021-07-31 DIAGNOSIS — M25562 Pain in left knee: Secondary | ICD-10-CM | POA: Insufficient documentation

## 2021-07-31 MED ORDER — CHLORTHALIDONE 25 MG PO TABS
25.0000 mg | ORAL_TABLET | Freq: Every day | ORAL | 1 refills | Status: DC
  Filled 2021-07-31: qty 30, 30d supply, fill #0

## 2021-07-31 NOTE — Addendum Note (Signed)
Addended by: Ronette Deter on: 07/31/2021 11:20 AM   Modules accepted: Orders

## 2021-07-31 NOTE — Patient Instructions (Signed)
[facility] [address] [place], [place] [address] Phone [phone] Fax [phone] 

## 2021-07-31 NOTE — Progress Notes (Signed)
Assessment & Plan:  Ashley Pena was seen today for hypertension.  Diagnoses and all orders for this visit:  Primary hypertension -     CMP14+EGFR -     chlorthalidone (HYGROTON) 25 MG tablet; Take 1 tablet (25 mg total) by mouth daily. Continue all antihypertensives as prescribed.  Reminded to bring in blood pressure log for follow  up appointment.  RECOMMENDATIONS: DASH/Mediterranean Diets are healthier choices for HTN.    Colon cancer screening -     Cancel: Fecal occult blood, imunochemical([facility]) -     Ambulatory referral to Gastroenterology  Encounter for complete eye exam -     Ambulatory referral to Ophthalmology  Chronic pain of both knees Continue as needed OTC Tylenol Walking is the best form of exercise for mild primary osteoarthritis  Primary insomnia Recommended over-the-counter medications: Ashwaghanda, tylenol pm or melatonin. Avoid caffeine products especially near bedtime   Patient has been counseled on age-appropriate routine health concerns for screening and prevention. These are reviewed and up-to-date. Referrals have been placed accordingly. Immunizations are up-to-date or declined.    Subjective:   Chief Complaint  Patient presents with   Hypertension   [name] 66 y.o. female presents to office today for follow up to HTN   HTN Home readings: She cannot recall.  I am discontinuing her hydrochlorothiazide and starting her on chlorthalidone.  She will continue on amlodipine 10 mg daily as prescribed. BP Readings from Last 3 Encounters:  07/31/21 (!) 157/84  04/23/21 (!) 150/84  09/25/20 127/90     Knee Pain Chronic. Pain relieved with OTC medication Tylenol.  She denies joint swelling, warmth, instability or pain with weight bearing.   Review of Systems  Constitutional:  Negative for fever, malaise/fatigue and weight loss.  HENT: Negative.  Negative for nosebleeds.   Eyes:  Positive for blurred vision. Negative for double  vision, photophobia, pain, discharge and redness.  Respiratory: Negative.  Negative for cough and shortness of breath.   Cardiovascular: Negative.  Negative for chest pain, palpitations and leg swelling.  Gastrointestinal: Negative.  Negative for heartburn, nausea and vomiting.  Musculoskeletal:  Positive for joint pain. Negative for myalgias.  Neurological: Negative.  Negative for dizziness, focal weakness, seizures and headaches.  Psychiatric/Behavioral: Negative.  Negative for suicidal ideas.     Past Medical History:  Diagnosis Date   Hypertension     Past Surgical History:  Procedure Laterality Date   CESAREAN SECTION     CYSTOSCOPY N/A 08/28/2019   Procedure: CYSTOSCOPY;  Surgeon: Aletha Halim, MD;  Location: [place];  Service: Gynecology;  Laterality: N/A;   LEEP  01/17/2019   TOTAL LAPAROSCOPIC HYSTERECTOMY WITH SALPINGECTOMY Bilateral 08/28/2019   Procedure: TOTAL LAPAROSCOPIC HYSTERECTOMY WITH SALPINGECTOMY;  Surgeon: Aletha Halim, MD;  Location: [place];  Service: Gynecology;  Laterality: Bilateral;    Family History  Problem Relation Age of Onset   Hypertension Mother     Social History Reviewed with no changes to be made today.   Outpatient Medications Prior to Visit  Medication Sig Dispense Refill   amLODipine (NORVASC) 10 MG tablet Take 1 tablet (10 mg total) by mouth daily. 90 tablet 1   atorvastatin (LIPITOR) 20 MG tablet Take 1 tablet (20 mg total) by mouth daily. 90 tablet 3   famotidine (PEPCID) 20 MG tablet Take 1 tablet (20 mg total) by mouth 2 (two) times daily. 60 tablet 1   hydrochlorothiazide (HYDRODIURIL) 25 MG tablet Take 1 tablet (25 mg total) by mouth daily. [place]  tablet 1   No facility-administered medications prior to visit.    No Known Allergies     Objective:    BP (!) 157/84   Pulse 71   Wt 126 lb 3.2 oz (57.2 kg)   SpO2 98%   BMI 25.06 kg/m  Wt Readings from Last 3 Encounters:  07/31/21 126 lb 3.2 oz (57.2 kg)  04/22/21 125 lb  (56.7 kg)  09/25/20 121 lb 4.8 oz (55 kg)    Physical Exam Vitals and nursing note reviewed.  Constitutional:      Appearance: She is well-developed.  HENT:     Head: Normocephalic and atraumatic.  Cardiovascular:     Rate and Rhythm: Normal rate and regular rhythm.     Heart sounds: Normal heart sounds. No murmur heard.    No friction rub. No gallop.  Pulmonary:     Effort: Pulmonary effort is normal. No tachypnea or respiratory distress.     Breath sounds: Normal breath sounds. No decreased breath sounds, wheezing, rhonchi or rales.  Chest:     Chest wall: No tenderness.  Abdominal:     General: Bowel sounds are normal.     Palpations: Abdomen is soft.  Musculoskeletal:        General: Normal range of motion.     Cervical back: Normal range of motion.  Skin:    General: Skin is warm and dry.  Neurological:     Mental Status: She is alert and oriented to person, place, and time.     Coordination: Coordination normal.  Psychiatric:        Behavior: Behavior normal. Behavior is cooperative.        Thought Content: Thought content normal.        Judgment: Judgment normal.          Patient has been counseled extensively about nutrition and exercise as well as the importance of adherence with medications and regular follow-up. The patient was given clear instructions to go to ER or return to medical center if symptoms don't improve, worsen or new problems develop. The patient verbalized understanding.   Follow-up: Return for 3 weeks nurse visit on a Friday for BP check and Labs BMP. See me end of September.   Gildardo Pounds, FNP-BC [facility] and [facility] [place], [place]   07/31/2021, 11:07 AM

## 2021-08-01 LAB — CMP14+EGFR
ALT: 14 IU/L (ref 0–32)
AST: 19 IU/L (ref 0–40)
Albumin/Globulin Ratio: 1.5 (ref 1.2–2.2)
Albumin: 4.4 g/dL (ref 3.9–4.9)
Alkaline Phosphatase: 71 IU/L (ref 44–121)
BUN/Creatinine Ratio: 7 — ABNORMAL LOW (ref 12–28)
BUN: 6 mg/dL — ABNORMAL LOW (ref 8–27)
Bilirubin Total: 0.4 mg/dL (ref 0.0–1.2)
CO2: 20 mmol/L (ref 20–29)
Calcium: 9.6 mg/dL (ref 8.7–10.3)
Chloride: 106 mmol/L (ref 96–106)
Creatinine, Ser: 0.87 mg/dL (ref 0.57–1.00)
Globulin, Total: 2.9 g/dL (ref 1.5–4.5)
Glucose: 81 mg/dL (ref 70–99)
Potassium: 3.8 mmol/L (ref 3.5–5.2)
Sodium: 140 mmol/L (ref 134–144)
Total Protein: 7.3 g/dL (ref 6.0–8.5)
eGFR: 73 mL/min/{1.73_m2} (ref >59)

## 2021-08-21 ENCOUNTER — Other Ambulatory Visit: Payer: Self-pay | Admitting: Nurse Practitioner

## 2021-08-21 ENCOUNTER — Ambulatory Visit: Payer: Medicare Other

## 2021-08-21 ENCOUNTER — Ambulatory Visit: Payer: Medicare Other | Attending: Nurse Practitioner

## 2021-08-21 DIAGNOSIS — I10 Essential (primary) hypertension: Secondary | ICD-10-CM

## 2021-08-21 MED ORDER — BLOOD PRESSURE MONITOR DEVI: 0 refills | Status: DC

## 2021-08-22 LAB — CMP14+EGFR
ALT: 13 IU/L (ref 0–32)
AST: 18 IU/L (ref 0–40)
Albumin/Globulin Ratio: 1.4 (ref 1.2–2.2)
Albumin: 4.8 g/dL (ref 3.9–4.9)
Alkaline Phosphatase: 88 IU/L (ref 44–121)
BUN/Creatinine Ratio: 8 — ABNORMAL LOW (ref 12–28)
BUN: 8 mg/dL (ref 8–27)
Bilirubin Total: 0.5 mg/dL (ref 0.0–1.2)
CO2: 28 mmol/L (ref 20–29)
Calcium: 10.1 mg/dL (ref 8.7–10.3)
Chloride: 96 mmol/L (ref 96–106)
Creatinine, Ser: 0.96 mg/dL (ref 0.57–1.00)
Globulin, Total: 3.4 g/dL (ref 1.5–4.5)
Glucose: 81 mg/dL (ref 70–99)
Potassium: 3.2 mmol/L — ABNORMAL LOW (ref 3.5–5.2)
Sodium: 140 mmol/L (ref 134–144)
Total Protein: 8.2 g/dL (ref 6.0–8.5)
eGFR: 64 mL/min/{1.73_m2} (ref >59)

## 2021-08-23 ENCOUNTER — Other Ambulatory Visit: Payer: Self-pay | Admitting: Nurse Practitioner

## 2021-08-23 DIAGNOSIS — I10 Essential (primary) hypertension: Secondary | ICD-10-CM

## 2021-08-23 MED ORDER — VALSARTAN 40 MG PO TABS
40.0000 mg | ORAL_TABLET | Freq: Every day | ORAL | 3 refills | Status: DC
  Filled 2021-08-23: qty 90, 90d supply, fill #0

## 2021-08-23 NOTE — Progress Notes (Signed)
Low potassium. Chlorthalidone dc'd.

## 2021-08-24 ENCOUNTER — Other Ambulatory Visit: Payer: Self-pay

## 2021-08-27 ENCOUNTER — Other Ambulatory Visit: Payer: Self-pay

## 2021-09-02 ENCOUNTER — Other Ambulatory Visit: Payer: Self-pay

## 2021-09-10 ENCOUNTER — Other Ambulatory Visit (HOSPITAL_COMMUNITY): Payer: Self-pay

## 2021-09-10 ENCOUNTER — Other Ambulatory Visit: Payer: Self-pay

## 2021-10-06 ENCOUNTER — Ambulatory Visit: Payer: Medicare Other | Attending: Nurse Practitioner | Admitting: Pharmacist

## 2021-10-06 ENCOUNTER — Encounter: Payer: Self-pay | Admitting: Pharmacist

## 2021-10-06 VITALS — BP 122/86 | HR 97

## 2021-10-06 DIAGNOSIS — I10 Essential (primary) hypertension: Secondary | ICD-10-CM | POA: Insufficient documentation

## 2021-10-06 DIAGNOSIS — Z713 Dietary counseling and surveillance: Secondary | ICD-10-CM | POA: Insufficient documentation

## 2021-10-06 MED ORDER — BLOOD PRESSURE MONITOR DEVI: 0 refills | Status: AC

## 2021-10-06 NOTE — Progress Notes (Signed)
   S:     No chief complaint on file.  Ashley Pena is a 65 y.o. female who presents for hypertension evaluation, education, and management.  PMH is significant for HTN.  Patient was referred and last seen by Primary Care Provider, [name], on 07/31/2021. BP was 157/84 mmHg at that visit. HCTZ was changed to chlorthalidone.    Today, patient arrives in good spirits and presents with her daughter who serves as her Optometrist. Denies dizziness, headache, blurred vision, swelling.   Patient reports hypertension is longstanding.   Family/Social history:  Fhx: HTN Tobacco: never smoker  Alcohol: none reported  Medication adherence reported . Patient has taken BP medications today.   Current antihypertensives include: amlodipine 10 mg daily, valsartan mg daily   Reported home BP readings: none   Patient reported dietary habits:  -Compliant with sodium restriction -Denies excessive intake of caffeine   Patient-reported exercise habits:  -Walks ~2-3 times a week   O:  Vitals:   10/06/21 1621  BP: 122/86  Pulse: 97    Last 3 Office BP readings: BP Readings from Last 3 Encounters:  07/31/21 (!) 157/84  04/23/21 (!) 150/84  09/25/20 127/90    BMET    Component Value Date/Time   NA 140 08/21/2021 1043   K 3.2 (L) 08/21/2021 1043   CL 96 08/21/2021 1043   CO2 28 08/21/2021 1043   GLUCOSE 81 08/21/2021 1043   GLUCOSE 89 08/24/2019 0947   BUN 8 08/21/2021 1043   CREATININE 0.96 08/21/2021 1043   CREATININE 0.78 10/20/2015 1135   CALCIUM 10.1 08/21/2021 1043   GFRNONAA >60 08/24/2019 0947   GFRNONAA 82 10/20/2015 1135   GFRAA >60 08/24/2019 0947   GFRAA >89 10/20/2015 1135    Renal function: CrCl cannot be calculated (Patient's most recent lab result is older than the maximum 21 days allowed.).  Clinical ASCVD: No  The 10-year ASCVD risk score (Arnett DK, et al., 2019) is: 17.5%   Values used to calculate the score:     Age: 30 years     Sex: Female     Is  Non-Hispanic African American: Yes     Diabetic: No     Tobacco smoker: No     Systolic Blood Pressure: 626 mmHg     Is BP treated: Yes     HDL Cholesterol: 62 mg/dL     Total Cholesterol: 220 mg/dL  A/P: Hypertension longstanding currently close to goal on current medications. BP goal < 130//80 mmHg. Medication adherence appears appropriate.  -Continued current regimen.  -F/u labs ordered - BMP -Counseled on lifestyle modifications for blood pressure control including reduced dietary sodium, increased exercise, adequate sleep. -Encouraged patient to check BP at home and bring log of readings to next visit. Counseled on proper use of home BP cuff.    Results reviewed and written information provided.    Written patient instructions provided. Patient verbalized understanding of treatment plan.  Total time in face to face counseling 15 minutes.    Follow-up:  Pharmacist prn. PCP clinic visit 10/16/2021.  Benard Halsted, PharmD, [place], [place] [phone]

## 2021-10-07 LAB — BMP8+EGFR
BUN/Creatinine Ratio: 9 — ABNORMAL LOW (ref 12–28)
BUN: 8 mg/dL (ref 8–27)
CO2: 22 mmol/L (ref 20–29)
Calcium: 9.7 mg/dL (ref 8.7–10.3)
Chloride: 105 mmol/L (ref 96–106)
Creatinine, Ser: 0.9 mg/dL (ref 0.57–1.00)
Glucose: 89 mg/dL (ref 70–99)
Potassium: 3.8 mmol/L (ref 3.5–5.2)
Sodium: 141 mmol/L (ref 134–144)
eGFR: 70 mL/min/{1.73_m2} (ref >59)

## 2021-10-16 ENCOUNTER — Ambulatory Visit: Payer: Medicare Other | Attending: Nurse Practitioner | Admitting: Nurse Practitioner

## 2021-10-16 ENCOUNTER — Encounter: Payer: Self-pay | Admitting: Nurse Practitioner

## 2021-10-16 VITALS — BP 131/81 | HR 77 | Temp 98.0°F | Ht 59.0 in | Wt 121.4 lb

## 2021-10-16 DIAGNOSIS — H538 Other visual disturbances: Secondary | ICD-10-CM

## 2021-10-16 DIAGNOSIS — Z1211 Encounter for screening for malignant neoplasm of colon: Secondary | ICD-10-CM

## 2021-10-16 DIAGNOSIS — I10 Essential (primary) hypertension: Secondary | ICD-10-CM

## 2021-10-16 DIAGNOSIS — Z23 Encounter for immunization: Secondary | ICD-10-CM | POA: Diagnosis not present

## 2021-10-16 DIAGNOSIS — K219 Gastro-esophageal reflux disease without esophagitis: Secondary | ICD-10-CM

## 2021-10-16 DIAGNOSIS — E785 Hyperlipidemia, unspecified: Secondary | ICD-10-CM | POA: Diagnosis not present

## 2021-10-16 MED ORDER — ATORVASTATIN CALCIUM 20 MG PO TABS: 20.0000 mg | ORAL_TABLET | Freq: Every day | ORAL | 1 refills | Status: DC

## 2021-10-16 MED ORDER — FAMOTIDINE 20 MG PO TABS: 20.0000 mg | ORAL_TABLET | Freq: Two times a day (BID) | ORAL | 1 refills | Status: DC

## 2021-10-16 MED ORDER — AMLODIPINE BESYLATE 10 MG PO TABS: 10.0000 mg | ORAL_TABLET | Freq: Every day | ORAL | 1 refills | Status: DC

## 2021-10-16 MED ORDER — VALSARTAN 40 MG PO TABS: 40.0000 mg | ORAL_TABLET | Freq: Every day | ORAL | 1 refills | Status: DC

## 2021-10-16 NOTE — Patient Instructions (Signed)
[facility] [place] [place], [place] [address] Phone [phone]

## 2021-10-16 NOTE — Progress Notes (Unsigned)
Assessment & Plan:  Ashley Pena was seen today for hypertension.  Diagnoses and all orders for this visit:  Essential hypertension Well controlled -     amLODipine (NORVASC) 10 MG tablet; Take 1 tablet (10 mg total) by mouth daily. -     valsartan (DIOVAN) 40 MG tablet; Take 1 tablet (40 mg total) by mouth daily. Continue all antihypertensives as prescribed.  Reminded to bring in blood pressure log for follow  up appointment.  RECOMMENDATIONS: DASH/Mediterranean Diets are healthier choices for HTN.    Blurred vision, bilateral -     Ambulatory referral to Ophthalmology  Gastroesophageal reflux disease without esophagitis Well controlled -     famotidine (PEPCID) 20 MG tablet; Take 1 tablet (20 mg total) by mouth 2 (two) times daily.  Need for influenza vaccination -     Flu Vaccine QUAD High Dose(Fluad)  Dyslipidemia, goal LDL below 100 -     atorvastatin (LIPITOR) 20 MG tablet; Take 1 tablet (20 mg total) by mouth daily. -     Lipid panel INSTRUCTIONS: Work on a low fat, heart healthy diet and participate in regular aerobic exercise program by working out at least 150 minutes per week; 5 days a week-30 minutes per day. Avoid red meat/beef/steak,  fried foods. junk foods, sodas, sugary drinks, unhealthy snacking, alcohol and smoking.  Drink at least 80 oz of water per day and monitor your carbohydrate intake daily.    Colon cancer screening -     Cologuard    Patient has been counseled on age-appropriate routine health concerns for screening and prevention. These are reviewed and up-to-date. Referrals have been placed accordingly. Immunizations are up-to-date or declined.    Subjective:   Chief Complaint  Patient presents with   Hypertension   [name] 66 y.o. female presents to office today for follow up to HTN. She is accompanied by her friend today who is interpreting for her.     HTN Blood pressure is well controlled with amlodpine 10 mg and valsartan 40 mg  daily.  BP Readings from Last 3 Encounters:  10/16/21 131/81  10/06/21 122/86  07/31/21 (!) 157/84     Requests ophthalmology referral today for bilateral eye blurred vision.    Review of Systems  Constitutional:  Negative for fever, malaise/fatigue and weight loss.  HENT: Negative.  Negative for nosebleeds.   Eyes:  Positive for blurred vision. Negative for double vision, photophobia, pain, discharge and redness.  Respiratory: Negative.  Negative for cough and shortness of breath.   Cardiovascular: Negative.  Negative for chest pain, palpitations and leg swelling.  Gastrointestinal: Negative.  Negative for heartburn, nausea and vomiting.  Musculoskeletal: Negative.  Negative for myalgias.  Neurological: Negative.  Negative for dizziness, focal weakness, seizures and headaches.  Psychiatric/Behavioral: Negative.  Negative for suicidal ideas.     Past Medical History:  Diagnosis Date   Hypertension     Past Surgical History:  Procedure Laterality Date   CESAREAN SECTION     CYSTOSCOPY N/A 08/28/2019   Procedure: CYSTOSCOPY;  Surgeon: Aletha Halim, MD;  Location: [place];  Service: Gynecology;  Laterality: N/A;   LEEP  01/17/2019   TOTAL LAPAROSCOPIC HYSTERECTOMY WITH SALPINGECTOMY Bilateral 08/28/2019   Procedure: TOTAL LAPAROSCOPIC HYSTERECTOMY WITH SALPINGECTOMY;  Surgeon: Aletha Halim, MD;  Location: [place];  Service: Gynecology;  Laterality: Bilateral;    Family History  Problem Relation Age of Onset   Hypertension Mother     Social History Reviewed with no changes to  be made today.   Outpatient Medications Prior to Visit  Medication Sig Dispense Refill   Blood Pressure Monitor DEVI Please provide patient with insurance approved blood pressure monitor. ICD10 I10.0 1 each 0   atorvastatin (LIPITOR) 20 MG tablet Take 1 tablet (20 mg total) by mouth daily. 90 tablet 3   famotidine (PEPCID) 20 MG tablet Take 1 tablet (20 mg total) by mouth 2 (two) times daily. 60  tablet 1   valsartan (DIOVAN) 40 MG tablet Take 1 tablet (40 mg total) by mouth daily. 90 tablet 3   amLODipine (NORVASC) 10 MG tablet Take 1 tablet (10 mg total) by mouth daily. 90 tablet 1   No facility-administered medications prior to visit.    No Known Allergies     Objective:    BP 131/81   Pulse 77   Temp 98 F (36.7 C) (Oral)   Ht 4\' 11"  (1.499 m)   Wt 121 lb 6.4 oz (55.1 kg)   SpO2 98%   BMI 24.52 kg/m  Wt Readings from Last 3 Encounters:  10/16/21 121 lb 6.4 oz (55.1 kg)  07/31/21 126 lb 3.2 oz (57.2 kg)  04/22/21 125 lb (56.7 kg)    Physical Exam Vitals and nursing note reviewed.  Constitutional:      Appearance: She is well-developed.  HENT:     Head: Normocephalic and atraumatic.  Cardiovascular:     Rate and Rhythm: Normal rate and regular rhythm.     Heart sounds: Normal heart sounds. No murmur heard.    No friction rub. No gallop.  Pulmonary:     Effort: Pulmonary effort is normal. No tachypnea or respiratory distress.     Breath sounds: Normal breath sounds. No decreased breath sounds, wheezing, rhonchi or rales.  Chest:     Chest wall: No tenderness.  Abdominal:     General: Bowel sounds are normal.     Palpations: Abdomen is soft.  Musculoskeletal:        General: Normal range of motion.     Cervical back: Normal range of motion.  Skin:    General: Skin is warm and dry.  Neurological:     Mental Status: She is alert and oriented to person, place, and time.     Coordination: Coordination normal.  Psychiatric:        Behavior: Behavior normal. Behavior is cooperative.        Thought Content: Thought content normal.        Judgment: Judgment normal.          Patient has been counseled extensively about nutrition and exercise as well as the importance of adherence with medications and regular follow-up. The patient was given clear instructions to go to ER or return to medical center if symptoms don't improve, worsen or new problems  develop. The patient verbalized understanding.   Follow-up: Return in about 3 months (around 01/15/2022) for Shingles vaccine, HTN.   01/17/2022, FNP-BC [facility] and [facility] [place], [place]   10/19/2021, 9:57 PM

## 2021-10-17 LAB — LIPID PANEL
Chol/HDL Ratio: 3.6 ratio (ref 0.0–4.4)
Cholesterol, Total: 199 mg/dL (ref 100–199)
HDL: 56 mg/dL (ref >39)
LDL Chol Calc (NIH): 129 mg/dL — ABNORMAL HIGH (ref 0–99)
Triglycerides: 79 mg/dL (ref 0–149)
VLDL Cholesterol Cal: 14 mg/dL (ref 5–40)

## 2021-10-19 ENCOUNTER — Encounter: Payer: Self-pay | Admitting: Nurse Practitioner

## 2021-10-27 ENCOUNTER — Ambulatory Visit
Admission: RE | Admit: 2021-10-27 | Discharge: 2021-10-27 | Disposition: A | Discharge status: Home / Self Care | Payer: Medicare Other | Source: Ambulatory Visit | Attending: Nurse Practitioner | Admitting: Nurse Practitioner

## 2021-10-27 DIAGNOSIS — Z1231 Encounter for screening mammogram for malignant neoplasm of breast: Secondary | ICD-10-CM

## 2021-11-13 ENCOUNTER — Other Ambulatory Visit: Payer: Self-pay

## 2022-01-15 ENCOUNTER — Ambulatory Visit: Payer: Medicare Other | Attending: Nurse Practitioner | Admitting: Nurse Practitioner

## 2022-01-15 ENCOUNTER — Encounter: Payer: Self-pay | Admitting: Nurse Practitioner

## 2022-01-15 VITALS — BP 113/75 | HR 76 | Ht 59.0 in | Wt 121.6 lb

## 2022-01-15 DIAGNOSIS — Z8249 Family history of ischemic heart disease and other diseases of the circulatory system: Secondary | ICD-10-CM | POA: Insufficient documentation

## 2022-01-15 DIAGNOSIS — I10 Essential (primary) hypertension: Secondary | ICD-10-CM | POA: Insufficient documentation

## 2022-01-15 DIAGNOSIS — Z79899 Other long term (current) drug therapy: Secondary | ICD-10-CM | POA: Insufficient documentation

## 2022-01-15 NOTE — Progress Notes (Signed)
Assessment & Plan:  Ashley Pena was seen today for hypertension.  Diagnoses and all orders for this visit:  Primary hypertension Well controlled Continue amlodipine 10 mg daily and valsartan 40 mg daily as prescribed..  Reminded to bring in blood pressure log for follow  up appointment.  RECOMMENDATIONS: DASH/Mediterranean Diets are healthier choices for HTN.       Patient has been counseled on age-appropriate routine health concerns for screening and prevention. These are reviewed and up-to-date. Referrals have been placed accordingly. Immunizations are up-to-date or declined.    Subjective:   Chief Complaint  Patient presents with   Hypertension   [name] 66 y.o. female presents to office today accompanied by her helps to translate today.  She has a past medical history of Hypertension.   Blood pressure is well-controlled.  We had ordered a blood pressure monitor for her however it appears her insurance does not cover this. BP Readings from Last 3 Encounters:  01/15/22 113/75  10/16/21 131/81  10/06/21 122/86    Patient has been counseled on age-appropriate routine health concerns for screening and prevention. These are reviewed and up-to-date. Referrals have been placed accordingly. Immunizations are up-to-date or declined.     Cologuard: She has her Cologuard kit here with her today and I have instructed her to mail it as directed. Mammogram: Up-to-date and due October 2024 PAP SMEAR: not recommended due to age.  Review of Systems  Constitutional:  Negative for fever, malaise/fatigue and weight loss.  HENT: Negative.  Negative for nosebleeds.   Eyes: Negative.  Negative for blurred vision, double vision and photophobia.  Respiratory: Negative.  Negative for cough and shortness of breath.   Cardiovascular: Negative.  Negative for chest pain, palpitations and leg swelling.  Gastrointestinal: Negative.  Negative for heartburn, nausea and vomiting.  Musculoskeletal:  Negative.  Negative for myalgias.  Neurological: Negative.  Negative for dizziness, focal weakness, seizures and headaches.  Psychiatric/Behavioral: Negative.  Negative for suicidal ideas.     Past Medical History:  Diagnosis Date   Hypertension     Past Surgical History:  Procedure Laterality Date   CESAREAN SECTION     CYSTOSCOPY N/A 08/28/2019   Procedure: CYSTOSCOPY;  Surgeon: Aletha Halim, MD;  Location: [place];  Service: Gynecology;  Laterality: N/A;   LEEP  01/17/2019   TOTAL LAPAROSCOPIC HYSTERECTOMY WITH SALPINGECTOMY Bilateral 08/28/2019   Procedure: TOTAL LAPAROSCOPIC HYSTERECTOMY WITH SALPINGECTOMY;  Surgeon: Aletha Halim, MD;  Location: [place];  Service: Gynecology;  Laterality: Bilateral;    Family History  Problem Relation Age of Onset   Hypertension Mother     Social History Reviewed with no changes to be made today.   Outpatient Medications Prior to Visit  Medication Sig Dispense Refill   amLODipine (NORVASC) 10 MG tablet Take 1 tablet (10 mg total) by mouth daily. 90 tablet 1   atorvastatin (LIPITOR) 20 MG tablet Take 1 tablet (20 mg total) by mouth daily. 90 tablet 1   famotidine (PEPCID) 20 MG tablet Take 1 tablet (20 mg total) by mouth 2 (two) times daily. 180 tablet 1   valsartan (DIOVAN) 40 MG tablet Take 1 tablet (40 mg total) by mouth daily. 180 tablet 1   Blood Pressure Monitor DEVI Please provide patient with insurance approved blood pressure monitor. ICD10 I10.0 (Patient not taking: Reported on 01/15/2022) 1 each 0   No facility-administered medications prior to visit.    No Known Allergies     Objective:    BP 113/75  Pulse 76   Ht _0  (1.499 m)   Wt 121 lb 9.6 oz (55.2 kg)   SpO2 98%   BMI 24.56 kg/m  Wt Readings from Last 3 Encounters:  01/15/22 121 lb 9.6 oz (55.2 kg)  10/16/21 121 lb 6.4 oz (55.1 kg)  07/31/21 126 lb 3.2 oz (57.2 kg)    Physical Exam Vitals and nursing note reviewed. Exam conducted with a chaperone  present.  Constitutional:      Appearance: She is well-developed.  HENT:     Head: Normocephalic and atraumatic.  Cardiovascular:     Rate and Rhythm: Normal rate and regular rhythm.     Heart sounds: Normal heart sounds. No murmur heard.    No friction rub. No gallop.  Pulmonary:     Effort: Pulmonary effort is normal. No tachypnea or respiratory distress.     Breath sounds: Normal breath sounds. No decreased breath sounds, wheezing, rhonchi or rales.  Chest:     Chest wall: No tenderness.  Abdominal:     General: Bowel sounds are normal.     Palpations: Abdomen is soft.  Musculoskeletal:        General: Normal range of motion.     Cervical back: Normal range of motion.  Skin:    General: Skin is warm and dry.  Neurological:     Mental Status: She is alert and oriented to person, place, and time.     Coordination: Coordination normal.  Psychiatric:        Behavior: Behavior normal. Behavior is cooperative.        Thought Content: Thought content normal.        Judgment: Judgment normal.          Patient has been counseled extensively about nutrition and exercise as well as the importance of adherence with medications and regular follow-up. The patient was given clear instructions to go to ER or return to medical center if symptoms don't improve, worsen or new problems develop. The patient verbalized understanding.   Follow-up: Return in about 3 months (around 04/16/2022).   Gildardo Pounds, FNP-BC [facility] and [place], [place]   01/15/2022, 2:33 PM

## 2022-01-16 LAB — CMP14+EGFR
ALT: 14 IU/L (ref 0–32)
AST: 18 IU/L (ref 0–40)
Albumin/Globulin Ratio: 1.5 (ref 1.2–2.2)
Albumin: 4.5 g/dL (ref 3.9–4.9)
Alkaline Phosphatase: 87 IU/L (ref 44–121)
BUN/Creatinine Ratio: 9 — ABNORMAL LOW (ref 12–28)
BUN: 7 mg/dL — ABNORMAL LOW (ref 8–27)
Bilirubin Total: 0.7 mg/dL (ref 0.0–1.2)
CO2: 23 mmol/L (ref 20–29)
Calcium: 9.6 mg/dL (ref 8.7–10.3)
Chloride: 104 mmol/L (ref 96–106)
Creatinine, Ser: 0.79 mg/dL (ref 0.57–1.00)
Globulin, Total: 3 g/dL (ref 1.5–4.5)
Glucose: 75 mg/dL (ref 70–99)
Potassium: 3.8 mmol/L (ref 3.5–5.2)
Sodium: 142 mmol/L (ref 134–144)
Total Protein: 7.5 g/dL (ref 6.0–8.5)
eGFR: 81 mL/min/{1.73_m2} (ref >59)

## 2022-01-18 LAB — COLOGUARD

## 2022-03-22 ENCOUNTER — Telehealth: Payer: Self-pay | Admitting: Nurse Practitioner

## 2022-03-22 NOTE — Telephone Encounter (Signed)
Called patient to schedule Medicare Annual Wellness Visit (AWV). Left message for patient to call back and schedule Medicare Annual Wellness Visit (AWV).  Last date of AWV: due awvi 07/18/20 per [facility]    If any questions, please contact me at [phone].  Thank you ,  Barkley Boards AWV direct phone # [phone]

## 2022-03-23 ENCOUNTER — Telehealth: Payer: Self-pay | Admitting: Nurse Practitioner

## 2022-03-23 NOTE — Telephone Encounter (Signed)
Contacted Ashley Pena to schedule their annual wellness visit. Appointment made for 04/07/22.  Ashley Pena AWV direct phone # [phone]

## 2022-03-30 ENCOUNTER — Other Ambulatory Visit: Payer: Self-pay | Admitting: Nurse Practitioner

## 2022-03-30 DIAGNOSIS — I10 Essential (primary) hypertension: Secondary | ICD-10-CM

## 2022-03-30 DIAGNOSIS — K219 Gastro-esophageal reflux disease without esophagitis: Secondary | ICD-10-CM

## 2022-03-30 DIAGNOSIS — E785 Hyperlipidemia, unspecified: Secondary | ICD-10-CM

## 2022-03-30 NOTE — Telephone Encounter (Signed)
Pt daughter is calling to see if prescription can be sent in for a 60 day supply.   Please advise.

## 2022-03-30 NOTE — Telephone Encounter (Signed)
Requested Prescriptions  Pending Prescriptions Disp Refills   amLODipine (NORVASC) 10 MG tablet [Pharmacy Med Name: AMLODIPINE BESYLATE 10 MG ORAL TABLET] 90 tablet 0    Sig: TAKE 1 TABLET (10 MG TOTAL) BY MOUTH DAILY ( EVENING)     Cardiovascular: Calcium Channel Blockers 2 Passed - 03/30/2022 11:39 AM      Passed - Last BP in normal range    BP Readings from Last 1 Encounters:  01/15/22 113/75         Passed - Last Heart Rate in normal range    Pulse Readings from Last 1 Encounters:  01/15/22 76         Passed - Valid encounter within last 6 months    Recent Outpatient Visits           2 months ago Primary hypertension   [place], Vernia Buff, NP   5 months ago Essential hypertension   [place], NP   5 months ago Essential hypertension   [place], Jarome Matin, RPH-CPP   8 months ago Primary hypertension   [place], Vernia Buff, NP   11 months ago Encounter for annual physical exam   [place], Vernia Buff, NP       Future Appointments             In 1 week Gildardo Pounds, NP [place]             famotidine (PEPCID) 20 MG tablet [Pharmacy Med Name: FAMOTIDINE 20 MG ORAL TABLET] 180 tablet 0    Sig: TAKE 1 TABLET (20 MG TOTAL) BY MOUTH 2 (TWO) TIMES DAILY (AM+EVENING)     Gastroenterology:  H2 Antagonists Passed - 03/30/2022 11:39 AM      Passed - Valid encounter within last 12 months    Recent Outpatient Visits           2 months ago Primary hypertension   [place], Vernia Buff, NP   5 months ago Essential hypertension   [place], Vernia Buff, NP   5 months ago Essential hypertension   [place], Jarome Matin, RPH-CPP   8 months ago Primary hypertension   [place], Vernia Buff, NP   11 months ago Encounter for annual physical exam   [place], Vernia Buff, NP       Future Appointments             In 1 week Gildardo Pounds, NP [place]             atorvastatin (LIPITOR) 20 MG tablet [Pharmacy Med Name: ATORVASTATIN CALCIUM 20 MG ORAL TABLET] 90 tablet 0    Sig: TAKE 1 TABLET (20 MG TOTAL) BY MOUTH DAILY (AM)     Cardiovascular:  Antilipid - Statins Failed - 03/30/2022 11:39 AM      Failed - Lipid Panel in normal range within the last 12 months    Cholesterol, Total  Date Value Ref Range Status  10/16/2021 199 100 - 199 mg/dL Final   LDL Chol Calc (NIH)  Date  Value Ref Range Status  10/16/2021 129 (H) 0 - 99 mg/dL Final   HDL  Date Value Ref Range Status  10/16/2021 56 >39 mg/dL Final   Triglycerides  Date Value Ref Range Status  10/16/2021 79 0 - 149 mg/dL Final         Passed - Patient is not pregnant      Passed - Valid encounter within last 12 months    Recent Outpatient Visits           2 months ago Primary hypertension   [place], Vernia Buff, NP   5 months ago Essential hypertension   [place], NP   5 months ago Essential hypertension   [place], Jarome Matin, RPH-CPP   8 months ago Primary hypertension   [place], Vernia Buff, NP   11 months ago Encounter for annual physical exam   [facility] [place], Vernia Buff, NP       Future Appointments             In 1 week Gildardo Pounds, NP [place]

## 2022-04-06 ENCOUNTER — Ambulatory Visit: Payer: Medicare Other | Admitting: Nurse Practitioner

## 2022-04-07 ENCOUNTER — Telehealth: Payer: Self-pay

## 2022-04-07 ENCOUNTER — Ambulatory Visit: Payer: Medicare Other | Attending: Nurse Practitioner

## 2022-04-07 VITALS — Wt 121.0 lb

## 2022-04-07 DIAGNOSIS — Z0000 Encounter for general adult medical examination without abnormal findings: Secondary | ICD-10-CM | POA: Diagnosis not present

## 2022-04-07 NOTE — Patient Instructions (Signed)
Ashley Pena , Thank you for taking time to come for your Medicare Wellness Visit. I appreciate your ongoing commitment to your health goals. Please review the following plan we discussed and let me know if I can assist you in the future.   These are the goals we discussed:  Goals      Patient Stated     04/07/2022, wants to keep BP under control        This is a list of the screening recommended for you and due dates:  Health Maintenance  Topic Date Due   Cologuard (Stool DNA test)  Never done   DEXA scan (bone density measurement)  Never done   Stool Blood Test  05/10/2018   Zoster (Shingles) Vaccine (2 of 2) 08/18/2021   COVID-19 Vaccine (3 - 2023-24 season) 09/18/2021   Medicare Annual Wellness Visit  04/07/2023   Mammogram  10/28/2023   DTaP/Tdap/Td vaccine (2 - Td or Tdap) 04/27/2026   Pneumonia Vaccine  Completed   Flu Shot  Completed   Hepatitis C Screening: USPSTF Recommendation to screen - Ages 18-79 yo.  Completed   HPV Vaccine  Aged Out    Advanced directives: Advance directive discussed with you today.   Conditions/risks identified: none  Next appointment: Follow up in one year for your annual wellness visit    Preventive Care 65 Years and Older, Female Preventive care refers to lifestyle choices and visits with your health care provider that can promote health and wellness. What does preventive care include? A yearly physical exam. This is also called an annual well check. Dental exams once or twice a year. Routine eye exams. Ask your health care provider how often you should have your eyes checked. Personal lifestyle choices, including: Daily care of your teeth and gums. Regular physical activity. Eating a healthy diet. Avoiding tobacco and drug use. Limiting alcohol use. Practicing safe sex. Taking low-dose aspirin every day. Taking vitamin and mineral supplements as recommended by your health care provider. What happens during an annual well  check? The services and screenings done by your health care provider during your annual well check will depend on your age, overall health, lifestyle risk factors, and family history of disease. Counseling  Your health care provider may ask you questions about your: Alcohol use. Tobacco use. Drug use. Emotional well-being. Home and relationship well-being. Sexual activity. Eating habits. History of falls. Memory and ability to understand (cognition). Work and work Statistician. Reproductive health. Screening  You may have the following tests or measurements: Height, weight, and BMI. Blood pressure. Lipid and cholesterol levels. These may be checked every 5 years, or more frequently if you are over 61 years old. Skin check. Lung cancer screening. You may have this screening every year starting at age 16 if you have a 30-pack-year history of smoking and currently smoke or have quit within the past 15 years. Fecal occult blood test (FOBT) of the stool. You may have this test every year starting at age 11. Flexible sigmoidoscopy or colonoscopy. You may have a sigmoidoscopy every 5 years or a colonoscopy every 10 years starting at age 62. Hepatitis C blood test. Hepatitis B blood test. Sexually transmitted disease (STD) testing. Diabetes screening. This is done by checking your blood sugar (glucose) after you have not eaten for a while (fasting). You may have this done every 1-3 years. Bone density scan. This is done to screen for osteoporosis. You may have this done starting at age 12. Mammogram. This may  be done every 1-2 years. Talk to your health care provider about how often you should have regular mammograms. Talk with your health care provider about your test results, treatment options, and if necessary, the need for more tests. Vaccines  Your health care provider may recommend certain vaccines, such as: Influenza vaccine. This is recommended every year. Tetanus, diphtheria, and  acellular pertussis (Tdap, Td) vaccine. You may need a Td booster every 10 years. Zoster vaccine. You may need this after age 6. Pneumococcal 13-valent conjugate (PCV13) vaccine. One dose is recommended after age 72. Pneumococcal polysaccharide (PPSV23) vaccine. One dose is recommended after age 54. Talk to your health care provider about which screenings and vaccines you need and how often you need them. This information is not intended to replace advice given to you by your health care provider. Make sure you discuss any questions you have with your health care provider. Document Released: 01/31/2015 Document Revised: 09/24/2015 Document Reviewed: 11/05/2014 Elsevier Interactive Patient Education  2017 [place] Prevention in the Home Falls can cause injuries. They can happen to people of all ages. There are many things you can do to make your home safe and to help prevent falls. What can I do on the outside of my home? Regularly fix the edges of walkways and driveways and fix any cracks. Remove anything that might make you trip as you walk through a door, such as a raised step or threshold. Trim any bushes or trees on the path to your home. Use bright outdoor lighting. Clear any walking paths of anything that might make someone trip, such as rocks or tools. Regularly check to see if handrails are loose or broken. Make sure that both sides of any steps have handrails. Any raised decks and porches should have guardrails on the edges. Have any leaves, snow, or ice cleared regularly. Use sand or salt on walking paths during winter. Clean up any spills in your garage right away. This includes oil or grease spills. What can I do in the bathroom? Use night lights. Install grab bars by the toilet and in the tub and shower. Do not use towel bars as grab bars. Use non-skid mats or decals in the tub or shower. If you need to sit down in the shower, use a plastic, non-slip stool. Keep  the floor dry. Clean up any water that spills on the floor as soon as it happens. Remove soap buildup in the tub or shower regularly. Attach bath mats securely with double-sided non-slip rug tape. Do not have throw rugs and other things on the floor that can make you trip. What can I do in the bedroom? Use night lights. Make sure that you have a light by your bed that is easy to reach. Do not use any sheets or blankets that are too big for your bed. They should not hang down onto the floor. Have a firm chair that has side arms. You can use this for support while you get dressed. Do not have throw rugs and other things on the floor that can make you trip. What can I do in the kitchen? Clean up any spills right away. Avoid walking on wet floors. Keep items that you use a lot in easy-to-reach places. If you need to reach something above you, use a strong step stool that has a grab bar. Keep electrical cords out of the way. Do not use floor polish or wax that makes floors slippery. If you must use  wax, use non-skid floor wax. Do not have throw rugs and other things on the floor that can make you trip. What can I do with my stairs? Do not leave any items on the stairs. Make sure that there are handrails on both sides of the stairs and use them. Fix handrails that are broken or loose. Make sure that handrails are as long as the stairways. Check any carpeting to make sure that it is firmly attached to the stairs. Fix any carpet that is loose or worn. Avoid having throw rugs at the top or bottom of the stairs. If you do have throw rugs, attach them to the floor with carpet tape. Make sure that you have a light switch at the top of the stairs and the bottom of the stairs. If you do not have them, ask someone to add them for you. What else can I do to help prevent falls? Wear shoes that: Do not have high heels. Have rubber bottoms. Are comfortable and fit you well. Are closed at the toe. Do not  wear sandals. If you use a stepladder: Make sure that it is fully opened. Do not climb a closed stepladder. Make sure that both sides of the stepladder are locked into place. Ask someone to hold it for you, if possible. Clearly mark and make sure that you can see: Any grab bars or handrails. First and last steps. Where the edge of each step is. Use tools that help you move around (mobility aids) if they are needed. These include: Canes. Walkers. Scooters. Crutches. Turn on the lights when you go into a dark area. Replace any light bulbs as soon as they burn out. Set up your furniture so you have a clear path. Avoid moving your furniture around. If any of your floors are uneven, fix them. If there are any pets around you, be aware of where they are. Review your medicines with your doctor. Some medicines can make you feel dizzy. This can increase your chance of falling. Ask your doctor what other things that you can do to help prevent falls. This information is not intended to replace advice given to you by your health care provider. Make sure you discuss any questions you have with your health care provider. Document Released: 10/31/2008 Document Revised: 06/12/2015 Document Reviewed: 02/08/2014 Elsevier Interactive Patient Education  2017 [employer].

## 2022-04-07 NOTE — Telephone Encounter (Signed)
   Telephone encounter was:  Unsuccessful.  04/07/2022 Name: Ashley Pena MRN: [id number] DOB: 1955/11/18  Unsuccessful outbound call made today to assist with:  Food Insecurity  Outreach Attempt:  1st Attempt  A HIPAA compliant voice message was left requesting a return call.  Instructed patient to call back at [phone].  [place] Resource Care Guide   [email or web address]  ?? [phone]   Website: [email or web address]  [email or web address]

## 2022-04-07 NOTE — Progress Notes (Signed)
I connected with  Ashley Pena on 04/07/22 by a audio enabled telemedicine application and verified that I am speaking with the correct person using two identifiers. Daughter Ashley Pena was also on the call interpreting.  Patient Location: Home  Provider Location: Office/Clinic  I discussed the limitations of evaluation and management by telemedicine. The patient expressed understanding and agreed to proceed.  Subjective:   Ashley Pena is a 67 y.o. female who presents for an Initial Medicare Annual Wellness Visit.  Review of Systems     Cardiac Risk Factors include: advanced age (>67men, >26 women);hypertension     Objective:    Today's Vitals   04/07/22 0934  Weight: 121 lb (54.9 kg)   Body mass index is 24.44 kg/m.     04/07/2022    9:42 AM 08/28/2019   11:11 AM 08/24/2019    9:36 AM 10/19/2017   10:23 AM 04/26/2016   10:53 AM 01/21/2016   10:25 AM 10/20/2015   11:15 AM  Advanced Directives  Does Patient Have a Medical Advance Directive? No No No No No No No  Would patient like information on creating a medical advance directive?   Yes (MAU/Ambulatory/Procedural Areas - Information given) Yes (MAU/Ambulatory/Procedural Areas - Information given) No - Patient declined      Current Medications (verified) Outpatient Encounter Medications as of 04/07/2022  Medication Sig   amLODipine (NORVASC) 10 MG tablet TAKE 1 TABLET (10 MG TOTAL) BY MOUTH DAILY ( EVENING)   atorvastatin (LIPITOR) 20 MG tablet TAKE 1 TABLET (20 MG TOTAL) BY MOUTH DAILY (AM)   famotidine (PEPCID) 20 MG tablet TAKE 1 TABLET (20 MG TOTAL) BY MOUTH 2 (TWO) TIMES DAILY (AM+EVENING)   valsartan (DIOVAN) 40 MG tablet Take 1 tablet (40 mg total) by mouth daily.   Blood Pressure Monitor DEVI Please provide patient with insurance approved blood pressure monitor. ICD10 I10.0 (Patient not taking: Reported on 01/15/2022)   No facility-administered encounter medications on file as of 04/07/2022.    Allergies  (verified) Patient has no known allergies.   History: Past Medical History:  Diagnosis Date   Hypertension    Past Surgical History:  Procedure Laterality Date   CESAREAN SECTION     CYSTOSCOPY N/A 08/28/2019   Procedure: CYSTOSCOPY;  Surgeon: Aletha Halim, MD;  Location: [facility];  Service: Gynecology;  Laterality: N/A;   LEEP  01/17/2019   TOTAL LAPAROSCOPIC HYSTERECTOMY WITH SALPINGECTOMY Bilateral 08/28/2019   Procedure: TOTAL LAPAROSCOPIC HYSTERECTOMY WITH SALPINGECTOMY;  Surgeon: Aletha Halim, MD;  Location: [facility];  Service: Gynecology;  Laterality: Bilateral;   Family History  Problem Relation Age of Onset   Hypertension Mother    Social History   Socioeconomic History   Marital status: Single    Spouse name: Not on file   Number of children: Not on file   Years of education: Not on file   Highest education level: Not on file  Occupational History   Not on file  Tobacco Use   Smoking status: Never   Smokeless tobacco: Never  Vaping Use   Vaping Use: Never used  Substance and Sexual Activity   Alcohol use: No   Drug use: No   Sexual activity: Not Currently  Other Topics Concern   Not on file  Social History Narrative   Not on file   Social Determinants of Health   Financial Resource Strain: Medium Risk (04/07/2022)   Overall Financial Resource Strain (CARDIA)    Difficulty of Paying Living Expenses: Somewhat hard  Food Insecurity:  Food Insecurity Present (04/07/2022)   Hunger Vital Sign    Worried About Running Out of Food in the Last Year: Sometimes true    Ran Out of Food in the Last Year: Sometimes true  Transportation Needs: Unmet Transportation Needs (04/07/2022)   PRAPARE - Hydrologist (Medical): Yes    Lack of Transportation (Non-Medical): Yes  Physical Activity: Inactive (04/07/2022)   Exercise Vital Sign    Days of Exercise per Week: 0 days    Minutes of Exercise per Session: 0 min  Stress: Not on file  Social  Connections: Not on file    Tobacco Counseling Counseling given: Not Answered   Clinical Intake:  Pre-visit preparation completed: Yes  Pain : No/denies pain     Nutritional Risks: None Diabetes: No  How often do you need to have someone help you when you read instructions, pamphlets, or other written materials from your doctor or pharmacy?: 3 - Sometimes  Diabetic? no  Interpreter Needed?: Yes  Comments: daughter interprets Information entered by :: NAllen LPN   Activities of Daily Living    04/07/2022    9:45 AM  In your present state of health, do you have any difficulty performing the following activities:  Hearing? 0  Vision? 0  Difficulty concentrating or making decisions? 0  Walking or climbing stairs? 0  Dressing or bathing? 0  Doing errands, shopping? 0  Preparing Food and eating ? N  Using the Toilet? N  In the past six months, have you accidently leaked urine? N  Do you have problems with loss of bowel control? N  Managing your Medications? N  Managing your Finances? N  Housekeeping or managing your Housekeeping? N    Patient Care Team: Gildardo Pounds, NP as PCP - General (Nurse Practitioner) Daneil Dolin, MD as Consulting Physician (Gastroenterology)  Indicate any recent Medical Services you may have received from other than [facility] providers in the past year (date may be approximate).     Assessment:   This is a routine wellness examination for Ashley Pena.  Hearing/Vision screen Vision Screening - Comments:: No regular eye exams  Dietary issues and exercise activities discussed: Current Exercise Habits: The patient does not participate in regular exercise at present   Goals Addressed             This Visit's Progress    Patient Stated       04/07/2022, wants to keep BP under control       Depression Screen    04/07/2022    9:44 AM 01/15/2022   11:09 AM 10/16/2021   10:16 AM 07/31/2021   10:49 AM 04/22/2021    3:56 PM 09/25/2020     4:27 PM 10/10/2019    8:37 AM  PHQ 2/9 Scores  PHQ - 2 Score 0 2 3 2 2  0 2  PHQ- 9 Score  3 5 5 7  0 2    Fall Risk    04/07/2022    9:44 AM 01/15/2022   11:09 AM 10/16/2021    9:50 AM 07/31/2021   10:49 AM 04/22/2021    3:56 PM  Fall Risk   Falls in the past year? 0 0 0 0 0  Number falls in past yr: 0 0 0 0 0  Injury with Fall? 0 0 0 0 0  Risk for fall due to : Medication side effect No Fall Risks   No Fall Risks  Follow up Falls prevention discussed;Education provided;Falls  evaluation completed Falls evaluation completed   Follow up appointment    FALL RISK PREVENTION PERTAINING TO THE HOME:  Any stairs in or around the home? No  If so, are there any without handrails? N/a Home free of loose throw rugs in walkways, pet beds, electrical cords, etc? Yes  Adequate lighting in your home to reduce risk of falls? No   ASSISTIVE DEVICES UTILIZED TO PREVENT FALLS:  Life alert? No  Use of a cane, walker or w/c? No  Grab bars in the bathroom? Yes  Shower chair or bench in shower? No  Elevated toilet seat or a handicapped toilet? No   TIMED UP AND GO:  Was the test performed? No .      Cognitive Function:  6 CIT not administered due to language barrier. Patient appeared cognitive via conversation with daughter interpreting.        Immunizations Immunization History  Administered Date(s) Administered   Fluad Quad(high Dose 65+) 10/16/2021   Influenza,inj,Quad PF,6+ Mos 10/09/2014, 10/20/2015   PFIZER(Purple Top)SARS-COV-2 Vaccination 03/31/2019, 04/24/2019   PNEUMOCOCCAL CONJUGATE-20 07/31/2021   Tdap 04/26/2016   Zoster Recombinat (Shingrix) 06/23/2021    TDAP status: Up to date  Flu Vaccine status: Up to date  Pneumococcal vaccine status: Up to date  Covid-19 vaccine status: Completed vaccines  Qualifies for Shingles Vaccine? Yes   Zostavax completed No   Shingrix Completed?: needs second dose  Screening Tests Health Maintenance  Topic Date Due   Medicare  Annual Wellness (AWV)  Never done   Fecal DNA (Cologuard)  Never done   DEXA SCAN  Never done   COLON CANCER SCREENING ANNUAL FOBT  05/10/2018   Zoster Vaccines- Shingrix (2 of 2) 08/18/2021   COVID-19 Vaccine (3 - 2023-24 season) 09/18/2021   MAMMOGRAM  10/28/2023   DTaP/Tdap/Td (2 - Td or Tdap) 04/27/2026   Pneumonia Vaccine 25+ Years old  Completed   INFLUENZA VACCINE  Completed   Hepatitis C Screening  Completed   HPV VACCINES  Aged Out    Health Maintenance  Health Maintenance Due  Topic Date Due   Medicare Annual Wellness (AWV)  Never done   Fecal DNA (Cologuard)  Never done   DEXA SCAN  Never done   COLON CANCER SCREENING ANNUAL FOBT  05/10/2018   Zoster Vaccines- Shingrix (2 of 2) 08/18/2021   COVID-19 Vaccine (3 - 2023-24 season) 09/18/2021    Colorectal cancer screening: has cologuard kit  Mammogram status: Completed 10/27/2021. Repeat every year  Bone Density status: Ordered 04/22/2021. Pt provided with contact info and advised to call to schedule appt.  Lung Cancer Screening: (Low Dose CT Chest recommended if Age 43-80 years, 30 pack-year currently smoking OR have quit w/in 15years.) does not qualify.   Lung Cancer Screening Referral: no  Additional Screening:  Hepatitis C Screening: does qualify; Completed 10/20/2015  Vision Screening: Recommended annual ophthalmology exams for early detection of glaucoma and other disorders of the eye. Is the patient up to date with their annual eye exam?  No  Who is the provider or what is the name of the office in which the patient attends annual eye exams? none If pt is not established with a provider, would they like to be referred to a provider to establish care? No .   Dental Screening: Recommended annual dental exams for proper oral hygiene  Community Resource Referral / Chronic Care Management: CRR required this visit?  No   CCM required this visit?  No  Plan:     I have personally reviewed and noted the  following in the patient's chart:   Medical and social history Use of alcohol, tobacco or illicit drugs  Current medications and supplements including opioid prescriptions. Patient is not currently taking opioid prescriptions. Functional ability and status Nutritional status Physical activity Advanced directives List of other physicians Hospitalizations, surgeries, and ER visits in previous 12 months Vitals Screenings to include cognitive, depression, and falls Referrals and appointments  In addition, I have reviewed and discussed with patient certain preventive protocols, quality metrics, and best practice recommendations. A written personalized care plan for preventive services as well as general preventive health recommendations were provided to patient.     Kellie Simmering, LPN   [id number]   Nurse Notes: none  Due to this being a virtual visit, the after visit summary with patients personalized plan was offered to patient via mail or my-chart.  to pick up at office at next visit

## 2022-04-07 NOTE — Addendum Note (Signed)
Addended by: Glenna Durand E on: 04/07/2022 10:00 AM   Modules accepted: Orders

## 2022-04-09 ENCOUNTER — Telehealth: Payer: Self-pay

## 2022-04-09 NOTE — Telephone Encounter (Signed)
   Telephone encounter was:  Unsuccessful.  04/09/2022 Name: Ashley Pena MRN: [id number] DOB: May 12, 1955  Unsuccessful outbound call made today to assist with:  Transportation Needs  and Food Insecurity  Outreach Attempt:  2nd Attempt  A HIPAA compliant voice message was left requesting a return call.  Instructed patient to call back at [phone].  [place] Resource Care Guide   [email or web address]  ?? [phone]   Website: [email or web address]  [email or web address]

## 2022-04-12 ENCOUNTER — Telehealth: Payer: Self-pay

## 2022-04-12 NOTE — Telephone Encounter (Signed)
   Telephone encounter was:  Unsuccessful.  04/12/2022 Name: Kashay Swigert MRN: [id number] DOB: 04-May-1955  Unsuccessful outbound call made today to assist with:  Food Insecurity  Outreach Attempt:  3rd Attempt.  Referral closed unable to contact patient.  A HIPAA compliant voice message was left requesting a return call.  Instructed patient to call back at [phone].  [place] Resource Care Guide   [email or web address]  ?? [id number]   Website: [email or web address]  [email or web address]

## 2022-07-09 ENCOUNTER — Other Ambulatory Visit: Payer: Self-pay | Admitting: Nurse Practitioner

## 2022-07-09 DIAGNOSIS — I10 Essential (primary) hypertension: Secondary | ICD-10-CM

## 2022-07-09 DIAGNOSIS — E785 Hyperlipidemia, unspecified: Secondary | ICD-10-CM

## 2022-07-09 DIAGNOSIS — K219 Gastro-esophageal reflux disease without esophagitis: Secondary | ICD-10-CM

## 2022-08-25 ENCOUNTER — Ambulatory Visit: Payer: Medicare Other | Attending: Nurse Practitioner | Admitting: Nurse Practitioner

## 2022-08-25 ENCOUNTER — Encounter: Payer: Self-pay | Admitting: Nurse Practitioner

## 2022-08-25 VITALS — BP 120/78 | HR 66 | Ht 59.0 in | Wt 118.0 lb

## 2022-08-25 DIAGNOSIS — R7989 Other specified abnormal findings of blood chemistry: Secondary | ICD-10-CM | POA: Insufficient documentation

## 2022-08-25 DIAGNOSIS — Z1231 Encounter for screening mammogram for malignant neoplasm of breast: Secondary | ICD-10-CM

## 2022-08-25 DIAGNOSIS — Z1211 Encounter for screening for malignant neoplasm of colon: Secondary | ICD-10-CM

## 2022-08-25 DIAGNOSIS — E559 Vitamin D deficiency, unspecified: Secondary | ICD-10-CM | POA: Insufficient documentation

## 2022-08-25 DIAGNOSIS — E7800 Pure hypercholesterolemia, unspecified: Secondary | ICD-10-CM | POA: Insufficient documentation

## 2022-08-25 DIAGNOSIS — Z780 Asymptomatic menopausal state: Secondary | ICD-10-CM | POA: Insufficient documentation

## 2022-08-25 DIAGNOSIS — H538 Other visual disturbances: Secondary | ICD-10-CM | POA: Diagnosis not present

## 2022-08-25 DIAGNOSIS — I10 Essential (primary) hypertension: Secondary | ICD-10-CM | POA: Diagnosis not present

## 2022-08-25 DIAGNOSIS — Z79899 Other long term (current) drug therapy: Secondary | ICD-10-CM | POA: Insufficient documentation

## 2022-08-25 DIAGNOSIS — Z9189 Other specified personal risk factors, not elsewhere classified: Secondary | ICD-10-CM | POA: Insufficient documentation

## 2022-08-25 NOTE — Progress Notes (Signed)
Assessment & Plan:  Ashley Pena was seen today for hypertension.  Diagnoses and all orders for this visit:  Primary hypertension -     CMP14+EGFR  Colon cancer screening -     Ambulatory referral to Gastroenterology  Blurred vision, bilateral -     Ambulatory referral to Ophthalmology  Vitamin D deficiency disease -     VITAMIN D 25 Hydroxy (Vit-D Deficiency, Fractures)  At risk for decreased bone density -     DG Bone Density; Future  Breast cancer screening by mammogram -     MM 3D SCREENING MAMMOGRAM BILATERAL BREAST; Future  Pure hypercholesterolemia -     Lipid panel  Abnormal CBC -     CBC with Differential  Asymptomatic postmenopausal estrogen deficiency -     DG Bone Density; Future    Patient has been counseled on age-appropriate routine health concerns for screening and prevention. These are reviewed and up-to-date. Referrals have been placed accordingly. Immunizations are up-to-date or declined.    Subjective:   Chief Complaint  Patient presents with   Hypertension   [name] 67 y.o. female presents to office today for follow-up to hypertension.  She is lying down right bundle I think accompanied by her friend who helps translate for her.   Blood pressure is well-controlled.  She is taking amlodipine 10 mg and Diovan 40 mg daily as prescribed. BP Readings from Last 3 Encounters:  08/25/22 120/78  01/15/22 113/75  10/16/21 131/81     Patient has been counseled on age-appropriate routine health concerns for screening and prevention. These are reviewed and up-to-date. Referrals have been placed accordingly. Immunizations are up-to-date or declined.     Mammogram: Up-to-date Bone density: Overdue/referral place Colon cancer screening: Overdue/referral placed  Review of Systems  Constitutional:  Negative for fever, malaise/fatigue and weight loss.  HENT: Negative.  Negative for nosebleeds.   Eyes: Negative.  Negative for blurred vision, double  vision and photophobia.  Respiratory: Negative.  Negative for cough and shortness of breath.   Cardiovascular: Negative.  Negative for chest pain, palpitations and leg swelling.  Gastrointestinal: Negative.  Negative for heartburn, nausea and vomiting.  Musculoskeletal: Negative.  Negative for myalgias.  Neurological: Negative.  Negative for dizziness, focal weakness, seizures and headaches.  Psychiatric/Behavioral: Negative.  Negative for suicidal ideas.     Past Medical History:  Diagnosis Date   Hypertension     Past Surgical History:  Procedure Laterality Date   CESAREAN SECTION     CYSTOSCOPY N/A 08/28/2019   Procedure: CYSTOSCOPY;  Surgeon: [place] Bing, MD;  Location: [facility] OR;  Service: Gynecology;  Laterality: N/A;   LEEP  01/17/2019   TOTAL LAPAROSCOPIC HYSTERECTOMY WITH SALPINGECTOMY Bilateral 08/28/2019   Procedure: TOTAL LAPAROSCOPIC HYSTERECTOMY WITH SALPINGECTOMY;  Surgeon:  Bing, MD;  Location: [facility] OR;  Service: Gynecology;  Laterality: Bilateral;    Family History  Problem Relation Age of Onset   Hypertension Mother     Social History Reviewed with no changes to be made today.   Outpatient Medications Prior to Visit  Medication Sig Dispense Refill   amLODipine (NORVASC) 10 MG tablet TAKE 1 TABLET (10 MG TOTAL) BY MOUTH DAILY ( EVENING) 90 tablet 0   atorvastatin (LIPITOR) 20 MG tablet TAKE 1 TABLET (20 MG TOTAL) BY MOUTH DAILY (AM) 90 tablet 0   famotidine (PEPCID) 20 MG tablet TAKE 1 TABLET (20 MG TOTAL) BY MOUTH 2 (TWO) TIMES DAILY (AM+EVENING) 180 tablet 0   valsartan (DIOVAN) 40 MG  tablet Take 1 tablet (40 mg total) by mouth daily. 180 tablet 1   Blood Pressure Monitor DEVI Please provide patient with insurance approved blood pressure monitor. ICD10 I10.0 (Patient not taking: Reported on 01/15/2022) 1 each 0   No facility-administered medications prior to visit.    No Known Allergies     Objective:    BP 120/78 (BP Location: Left Arm,  Patient Position: Sitting, Cuff Size: Normal)   Pulse 66   Ht 4\' 11"  (1.499 m)   Wt 118 lb (53.5 kg)   SpO2 98%   BMI 23.83 kg/m  Wt Readings from Last 3 Encounters:  08/25/22 118 lb (53.5 kg)  04/07/22 121 lb (54.9 kg)  01/15/22 121 lb 9.6 oz (55.2 kg)    Physical Exam Vitals and nursing note reviewed.  Constitutional:      Appearance: She is well-developed.  HENT:     Head: Normocephalic and atraumatic.  Cardiovascular:     Rate and Rhythm: Normal rate and regular rhythm.     Heart sounds: Normal heart sounds. No murmur heard.    No friction rub. No gallop.  Pulmonary:     Effort: Pulmonary effort is normal. No tachypnea or respiratory distress.     Breath sounds: Normal breath sounds. No decreased breath sounds, wheezing, rhonchi or rales.  Chest:     Chest wall: No tenderness.  Abdominal:     General: Bowel sounds are normal.     Palpations: Abdomen is soft.  Musculoskeletal:        General: Normal range of motion.     Cervical back: Normal range of motion.  Skin:    General: Skin is warm and dry.  Neurological:     Mental Status: She is alert and oriented to person, place, and time.     Coordination: Coordination normal.  Psychiatric:        Behavior: Behavior normal. Behavior is cooperative.        Thought Content: Thought content normal.        Judgment: Judgment normal.          Patient has been counseled extensively about nutrition and exercise as well as the importance of adherence with medications and regular follow-up. The patient was given clear instructions to go to ER or return to medical center if symptoms don't improve, worsen or new problems develop. The patient verbalized understanding.   Follow-up: Return in about 4 months (around 12/25/2022).   Claiborne Rigg, FNP-BC [facility] [place], [place] [phone]   08/25/2022, 5:15 PM

## 2022-08-26 LAB — CBC WITH DIFFERENTIAL/PLATELET
Basophils Absolute: 0 x10E3/uL (ref 0.0–0.2)
Basos: 1 %
EOS (ABSOLUTE): 0.1 x10E3/uL (ref 0.0–0.4)
Eos: 1 %
Hematocrit: 34.1 % (ref 34.0–46.6)
Hemoglobin: 11.2 g/dL (ref 11.1–15.9)
Immature Grans (Abs): 0 x10E3/uL (ref 0.0–0.1)
Immature Granulocytes: 0 %
Lymphocytes Absolute: 1.8 x10E3/uL (ref 0.7–3.1)
Lymphs: 36 %
MCH: 27.7 pg (ref 26.6–33.0)
MCHC: 32.8 g/dL (ref 31.5–35.7)
MCV: 84 fL (ref 79–97)
Monocytes Absolute: 0.4 x10E3/uL (ref 0.1–0.9)
Monocytes: 7 %
Neutrophils Absolute: 2.7 x10E3/uL (ref 1.4–7.0)
Neutrophils: 55 %
Platelets: 355 x10E3/uL (ref 150–450)
RBC: 4.05 x10E6/uL (ref 3.77–5.28)
RDW: 13.7 % (ref 11.7–15.4)
WBC: 5 x10E3/uL (ref 3.4–10.8)

## 2022-08-26 LAB — VITAMIN D 25 HYDROXY (VIT D DEFICIENCY, FRACTURES): Vit D, 25-Hydroxy: 36.6 ng/mL (ref 30.0–100.0)

## 2022-09-24 ENCOUNTER — Ambulatory Visit: Payer: Self-pay | Admitting: *Deleted

## 2022-09-24 ENCOUNTER — Telehealth: Payer: Self-pay | Admitting: Nurse Practitioner

## 2022-09-24 NOTE — Telephone Encounter (Signed)
  Chief Complaint: Lab results given to daughter and pt.   Daughter interpreted Symptoms: N/A Frequency: N/A Pertinent Negatives: Patient denies N/A Disposition: [] ED /[] Urgent Care (no appt availability in office) / [] Appointment(In office/virtual)/ []  [place] [facility] [facility]/ [x] Home Care/ [] Refused Recommended Disposition /[] [facility]/ []  Follow-up with PCP Additional Notes: Lab results given

## 2022-09-24 NOTE — Telephone Encounter (Signed)
CRM created for lab results.

## 2022-09-24 NOTE — Telephone Encounter (Signed)
Copied from CRM [id number]. Topic: General - Other >> Sep 24, 2022  9:06 AM Macon Large wrote: Reason for CRM: Pt daughter stated pt received a letter to call the office for lab results. Cb# [phone]

## 2022-09-24 NOTE — Telephone Encounter (Signed)
Pt's daughter called in and interpreted but mother was there with her.   Lab result message from Bertram Denver, NP given dated 08/27/2022 at 11:35 AM.   Daughter asked for Cr. Value and it was given.   She thanked me for my help.   No other questions. Reason for Disposition  [1] Follow-up call to recent contact AND [2] information only call, no triage required  Answer Assessment - Initial Assessment Questions 1. REASON FOR CALL or QUESTION: "What is your reason for calling today?" or "How can I best help you?" or "What question do you have that I can help answer?"     Daughter and pt called in for lab results.   Message given.  Protocols used: Information Only Call - No Triage-A-AH

## 2022-09-24 NOTE — Telephone Encounter (Signed)
noted 

## 2022-09-24 NOTE — Telephone Encounter (Signed)
Return call unanswered.  

## 2022-09-30 ENCOUNTER — Other Ambulatory Visit: Payer: Self-pay | Admitting: Nurse Practitioner

## 2022-09-30 DIAGNOSIS — I10 Essential (primary) hypertension: Secondary | ICD-10-CM

## 2022-10-07 ENCOUNTER — Ambulatory Visit
Admission: RE | Admit: 2022-10-07 | Discharge: 2022-10-07 | Disposition: A | Discharge status: Home / Self Care | Payer: Medicare Other | Source: Ambulatory Visit | Attending: Nurse Practitioner | Admitting: Nurse Practitioner

## 2022-10-07 DIAGNOSIS — Z780 Asymptomatic menopausal state: Secondary | ICD-10-CM

## 2022-10-07 DIAGNOSIS — Z9189 Other specified personal risk factors, not elsewhere classified: Secondary | ICD-10-CM

## 2022-10-29 ENCOUNTER — Ambulatory Visit
Admission: RE | Admit: 2022-10-29 | Discharge: 2022-10-29 | Disposition: A | Discharge status: Home / Self Care | Payer: Medicare Other | Source: Ambulatory Visit | Attending: Nurse Practitioner | Admitting: Nurse Practitioner

## 2022-10-29 DIAGNOSIS — Z1231 Encounter for screening mammogram for malignant neoplasm of breast: Secondary | ICD-10-CM

## 2022-11-04 ENCOUNTER — Other Ambulatory Visit: Payer: Self-pay | Admitting: Family Medicine

## 2022-11-04 DIAGNOSIS — I10 Essential (primary) hypertension: Secondary | ICD-10-CM

## 2022-11-04 DIAGNOSIS — E785 Hyperlipidemia, unspecified: Secondary | ICD-10-CM

## 2022-12-07 ENCOUNTER — Encounter: Payer: Medicare Other | Admitting: Internal Medicine

## 2022-12-07 ENCOUNTER — Other Ambulatory Visit: Payer: Self-pay | Admitting: Family Medicine

## 2022-12-07 DIAGNOSIS — K219 Gastro-esophageal reflux disease without esophagitis: Secondary | ICD-10-CM

## 2022-12-29 ENCOUNTER — Ambulatory Visit: Payer: Medicare Other | Admitting: Nurse Practitioner

## 2023-02-03 ENCOUNTER — Other Ambulatory Visit: Payer: Self-pay | Admitting: Family Medicine

## 2023-02-03 DIAGNOSIS — E785 Hyperlipidemia, unspecified: Secondary | ICD-10-CM

## 2023-02-03 DIAGNOSIS — K219 Gastro-esophageal reflux disease without esophagitis: Secondary | ICD-10-CM

## 2023-02-03 DIAGNOSIS — I10 Essential (primary) hypertension: Secondary | ICD-10-CM

## 2023-04-13 ENCOUNTER — Encounter: Payer: Self-pay | Admitting: Nurse Practitioner

## 2023-04-13 ENCOUNTER — Ambulatory Visit: Payer: Medicare Other | Attending: Nurse Practitioner | Admitting: Nurse Practitioner

## 2023-04-13 VITALS — BP 106/70 | HR 85 | Temp 98.3°F | Resp 18 | Ht 59.0 in | Wt 117.8 lb

## 2023-04-13 DIAGNOSIS — I10 Essential (primary) hypertension: Secondary | ICD-10-CM | POA: Diagnosis present

## 2023-04-13 DIAGNOSIS — B9789 Other viral agents as the cause of diseases classified elsewhere: Secondary | ICD-10-CM | POA: Insufficient documentation

## 2023-04-13 DIAGNOSIS — Z1211 Encounter for screening for malignant neoplasm of colon: Secondary | ICD-10-CM | POA: Insufficient documentation

## 2023-04-13 DIAGNOSIS — K219 Gastro-esophageal reflux disease without esophagitis: Secondary | ICD-10-CM | POA: Diagnosis not present

## 2023-04-13 DIAGNOSIS — J029 Acute pharyngitis, unspecified: Secondary | ICD-10-CM

## 2023-04-13 DIAGNOSIS — E785 Hyperlipidemia, unspecified: Secondary | ICD-10-CM | POA: Insufficient documentation

## 2023-04-13 DIAGNOSIS — Z79899 Other long term (current) drug therapy: Secondary | ICD-10-CM | POA: Insufficient documentation

## 2023-04-13 DIAGNOSIS — J028 Acute pharyngitis due to other specified organisms: Secondary | ICD-10-CM | POA: Diagnosis not present

## 2023-04-13 DIAGNOSIS — Z0100 Encounter for examination of eyes and vision without abnormal findings: Secondary | ICD-10-CM

## 2023-04-13 LAB — POCT RAPID STREP A (OFFICE): Rapid Strep A Screen: NEGATIVE

## 2023-04-13 MED ORDER — VALSARTAN 40 MG PO TABS: 40.0000 mg | ORAL_TABLET | Freq: Every day | ORAL | 1 refills | Status: DC

## 2023-04-13 MED ORDER — ATORVASTATIN CALCIUM 20 MG PO TABS: 20.0000 mg | ORAL_TABLET | Freq: Every day | ORAL | 1 refills | Status: DC

## 2023-04-13 MED ORDER — FAMOTIDINE 20 MG PO TABS: 20.0000 mg | ORAL_TABLET | Freq: Two times a day (BID) | ORAL | 0 refills | Status: DC

## 2023-04-13 MED ORDER — AMLODIPINE BESYLATE 10 MG PO TABS: 10.0000 mg | ORAL_TABLET | Freq: Every day | ORAL | 1 refills | Status: DC

## 2023-04-13 NOTE — Progress Notes (Addendum)
 Assessment & Plan:  Ashley Pena was seen today for medical management of chronic issues and sore throat.  Diagnoses and all orders for this visit:  Essential hypertension -     amLODipine (NORVASC) 10 MG tablet; Take 1 tablet (10 mg total) by mouth daily. -     valsartan (DIOVAN) 40 MG tablet; Take 1 tablet (40 mg total) by mouth daily.  Dyslipidemia, goal LDL below 100 -     atorvastatin (LIPITOR) 20 MG tablet; Take 1 tablet (20 mg total) by mouth daily.  Gastroesophageal reflux disease without esophagitis -     famotidine (PEPCID) 20 MG tablet; Take 1 tablet (20 mg total) by mouth 2 (two) times daily. Take in morning and evening  Routine eye exam -     Ambulatory referral to Ophthalmology  Colon cancer screening -     Ambulatory referral to Gastroenterology  Viral pharyngitis May use warm salt water gargles to help soothe throat May also use honey to help soothe throat May alternate with Tylenol and Motrin every 4 hours for pain relief.   Patient has been counseled on age-appropriate routine health concerns for screening and prevention. These are reviewed and up-to-date. Referrals have been placed accordingly. Immunizations are up-to-date or declined.    Subjective:   Chief Complaint  Patient presents with   Medical Management of Chronic Issues   Sore Throat    Ashley Pena 68 y.o. female presents to office today for blood pressure check.   She has a past medical history of Hypertension.    She has been experiencing symptoms of sore throat and vocal hoarseness over the past 2-3 days. She denies fever, cough, chills, rhinorrhea or sinus congestion  Blood pressure is well controlled. She is currenlty taking valsartan 40 mg daily and amlodipine 10 mg daily. BP Readings from Last 3 Encounters:  04/13/23 106/70  08/25/22 120/78  01/15/22 113/75     Review of Systems  Constitutional:  Negative for fever, malaise/fatigue and weight loss.  HENT:  Positive for sore throat.  Negative for nosebleeds.   Eyes: Negative.  Negative for blurred vision, double vision and photophobia.  Respiratory: Negative.  Negative for cough and shortness of breath.   Cardiovascular: Negative.  Negative for chest pain, palpitations and leg swelling.  Gastrointestinal: Negative.  Negative for heartburn, nausea and vomiting.  Musculoskeletal: Negative.  Negative for myalgias.  Neurological: Negative.  Negative for dizziness, focal weakness, seizures and headaches.  Psychiatric/Behavioral: Negative.  Negative for suicidal ideas.     Past Medical History:  Diagnosis Date   Hypertension     Past Surgical History:  Procedure Laterality Date   CESAREAN SECTION     CYSTOSCOPY N/A 08/28/2019   Procedure: CYSTOSCOPY;  Surgeon: [place] Bing, MD;  Location: [facility] OR;  Service: Gynecology;  Laterality: N/A;   LEEP  01/17/2019   TOTAL LAPAROSCOPIC HYSTERECTOMY WITH SALPINGECTOMY Bilateral 08/28/2019   Procedure: TOTAL LAPAROSCOPIC HYSTERECTOMY WITH SALPINGECTOMY;  Surgeon: [place] Bing, MD;  Location: [facility] OR;  Service: Gynecology;  Laterality: Bilateral;    Family History  Problem Relation Age of Onset   Hypertension Mother     Social History Reviewed with no changes to be made today.   Outpatient Medications Prior to Visit  Medication Sig Dispense Refill   amLODipine (NORVASC) 10 MG tablet TAKE 1 TABLET (10 MG TOTAL) BY MOUTH DAILY ( EVENING) 30 tablet 0   atorvastatin (LIPITOR) 20 MG tablet TAKE 1 TABLET (20 MG TOTAL) BY MOUTH DAILY (AM) 30 tablet 0  famotidine (PEPCID) 20 MG tablet TAKE 1 TABLET BY MOUTH 2 (TWO) TIMES DAILY (AM+EVENING) 60 tablet 0   valsartan (DIOVAN) 40 MG tablet TAKE 1 TABLET (40 MG TOTAL) BY MOUTH DAILY. (AM) 180 tablet 1   Blood Pressure Monitor DEVI Please provide patient with insurance approved blood pressure monitor. ICD10 I10.0 (Patient not taking: Reported on 04/13/2023) 1 each 0   No facility-administered medications prior to visit.    No Known  Allergies     Objective:    BP 106/70 (BP Location: Left Arm, Patient Position: Sitting, Cuff Size: Normal)   Pulse 85   Temp 98.3 F (36.8 C) (Oral)   Resp 18   Ht 4\' 11"  (1.499 m)   Wt 117 lb 12.8 oz (53.4 kg)   SpO2 99%   BMI 23.79 kg/m  Wt Readings from Last 3 Encounters:  04/13/23 117 lb 12.8 oz (53.4 kg)  08/25/22 118 lb (53.5 kg)  04/07/22 121 lb (54.9 kg)    Physical Exam Vitals and nursing note reviewed.  Constitutional:      Appearance: She is well-developed.  HENT:     Head: Normocephalic and atraumatic.     Mouth/Throat:     Pharynx: Posterior oropharyngeal erythema present.     Tonsils: No tonsillar exudate or tonsillar abscesses.  Cardiovascular:     Rate and Rhythm: Normal rate and regular rhythm.     Heart sounds: Normal heart sounds. No murmur heard.    No friction rub. No gallop.  Pulmonary:     Effort: Pulmonary effort is normal. No tachypnea or respiratory distress.     Breath sounds: Normal breath sounds. No decreased breath sounds, wheezing, rhonchi or rales.  Chest:     Chest wall: No tenderness.  Abdominal:     General: Bowel sounds are normal.     Palpations: Abdomen is soft.  Musculoskeletal:        General: Normal range of motion.     Cervical back: Normal range of motion.  Skin:    General: Skin is warm and dry.  Neurological:     Mental Status: She is alert and oriented to person, place, and time.     Coordination: Coordination normal.  Psychiatric:        Behavior: Behavior normal. Behavior is cooperative.        Thought Content: Thought content normal.        Judgment: Judgment normal.          Patient has been counseled extensively about nutrition and exercise as well as the importance of adherence with medications and regular follow-up. The patient was given clear instructions to go to ER or return to medical center if symptoms don't improve, worsen or new problems develop. The patient verbalized understanding.    Follow-up: Return in about 3 months (around 07/14/2023) for physical.   Claiborne Rigg, FNP-BC [facility] [place], [place] [phone]   04/13/2023, 2:02 PM

## 2023-04-13 NOTE — Addendum Note (Signed)
 Addended by: Arbie Cookey on: 04/13/2023 02:59 PM   Modules accepted: Orders

## 2023-05-27 ENCOUNTER — Other Ambulatory Visit: Payer: Self-pay | Admitting: Nurse Practitioner

## 2023-05-27 DIAGNOSIS — K219 Gastro-esophageal reflux disease without esophagitis: Secondary | ICD-10-CM

## 2023-07-01 ENCOUNTER — Other Ambulatory Visit: Payer: Self-pay | Admitting: Nurse Practitioner

## 2023-07-01 DIAGNOSIS — K219 Gastro-esophageal reflux disease without esophagitis: Secondary | ICD-10-CM

## 2023-07-15 ENCOUNTER — Other Ambulatory Visit: Payer: Self-pay

## 2023-07-15 ENCOUNTER — Encounter: Payer: Self-pay | Admitting: Nurse Practitioner

## 2023-07-15 ENCOUNTER — Ambulatory Visit: Attending: Nurse Practitioner | Admitting: Nurse Practitioner

## 2023-07-15 VITALS — BP 133/80 | HR 71 | Resp 19 | Ht 59.0 in | Wt 115.8 lb

## 2023-07-15 DIAGNOSIS — R7989 Other specified abnormal findings of blood chemistry: Secondary | ICD-10-CM | POA: Diagnosis not present

## 2023-07-15 DIAGNOSIS — K219 Gastro-esophageal reflux disease without esophagitis: Secondary | ICD-10-CM

## 2023-07-15 DIAGNOSIS — E7800 Pure hypercholesterolemia, unspecified: Secondary | ICD-10-CM | POA: Diagnosis not present

## 2023-07-15 DIAGNOSIS — Z0000 Encounter for general adult medical examination without abnormal findings: Secondary | ICD-10-CM | POA: Diagnosis not present

## 2023-07-15 DIAGNOSIS — Z23 Encounter for immunization: Secondary | ICD-10-CM | POA: Diagnosis not present

## 2023-07-15 DIAGNOSIS — E785 Hyperlipidemia, unspecified: Secondary | ICD-10-CM

## 2023-07-15 DIAGNOSIS — I10 Essential (primary) hypertension: Secondary | ICD-10-CM

## 2023-07-15 MED ORDER — VALSARTAN 40 MG PO TABS
40.0000 mg | ORAL_TABLET | Freq: Every day | ORAL | 1 refills | Status: AC
Start: 2023-07-15 — End: ?
  Filled 2023-07-15: qty 90, 90d supply, fill #0

## 2023-07-15 MED ORDER — AMLODIPINE BESYLATE 10 MG PO TABS
10.0000 mg | ORAL_TABLET | Freq: Every day | ORAL | 1 refills | Status: DC
  Filled 2023-07-15: qty 90, 90d supply, fill #0

## 2023-07-15 MED ORDER — FAMOTIDINE 20 MG PO TABS
20.0000 mg | ORAL_TABLET | Freq: Two times a day (BID) | ORAL | 1 refills | Status: DC
  Filled 2023-07-15: qty 180, 90d supply, fill #0

## 2023-07-15 MED ORDER — ATORVASTATIN CALCIUM 20 MG PO TABS
20.0000 mg | ORAL_TABLET | Freq: Every day | ORAL | 1 refills | Status: DC
Start: 2023-07-15 — End: 2023-11-21
  Filled 2023-07-15: qty 90, 90d supply, fill #0

## 2023-07-15 NOTE — Patient Instructions (Addendum)
 [facility] Ophthalmology   [address] [place], [place] [address] PH# [phone] Fax  [phone]     Colonoscopy Placed in [facility] [address] [place], [place] [address] PH# [phone]

## 2023-07-15 NOTE — Addendum Note (Signed)
 Addended by: Lachina Salsberry on: 07/15/2023 03:16 PM   Modules accepted: Orders

## 2023-07-15 NOTE — Progress Notes (Signed)
 Assessment & Plan:  Ashley Pena was seen today for annual exam.  Diagnoses and all orders for this visit:  Encounter for annual physical exam  Abnormal CBC -     CBC with Differential  Pure hypercholesterolemia -     Lipid panel  Primary hypertension -     CMP14+EGFR    Patient has been counseled on age-appropriate routine health concerns for screening and prevention. These are reviewed and up-to-date. Referrals have been placed accordingly. Immunizations are up-to-date or declined.    Subjective:   Chief Complaint  Patient presents with   Annual Exam    Ashley Pena 68 y.o. female presents to office today for annual physical  Patient has been counseled on age-appropriate routine health concerns for screening and prevention. These are reviewed and up-to-date. Referrals have been placed accordingly. Immunizations are up-to-date or declined.     MAMMOGRAM: UTD COLON CANCER SCREENING: OVERDUE. Referral placed    Review of Systems  Constitutional:  Negative for fever, malaise/fatigue and weight loss.  HENT: Negative.  Negative for nosebleeds.   Eyes: Negative.  Negative for blurred vision, double vision and photophobia.  Respiratory: Negative.  Negative for cough and shortness of breath.   Cardiovascular: Negative.  Negative for chest pain, palpitations and leg swelling.  Gastrointestinal: Negative.  Negative for heartburn, nausea and vomiting.  Genitourinary: Negative.   Musculoskeletal: Negative.  Negative for myalgias.  Skin: Negative.   Neurological: Negative.  Negative for dizziness, focal weakness, seizures and headaches.  Endo/Heme/Allergies: Negative.   Psychiatric/Behavioral: Negative.  Negative for suicidal ideas.     Past Medical History:  Diagnosis Date   Hypertension     Past Surgical History:  Procedure Laterality Date   CESAREAN SECTION     CYSTOSCOPY N/A 08/28/2019   Procedure: CYSTOSCOPY;  Surgeon: Izell Harari, MD;  Location: [facility] OR;  Service:  Gynecology;  Laterality: N/A;   LEEP  01/17/2019   TOTAL LAPAROSCOPIC HYSTERECTOMY WITH SALPINGECTOMY Bilateral 08/28/2019   Procedure: TOTAL LAPAROSCOPIC HYSTERECTOMY WITH SALPINGECTOMY;  Surgeon: Izell Harari, MD;  Location: [facility] OR;  Service: Gynecology;  Laterality: Bilateral;    Family History  Problem Relation Age of Onset   Hypertension Mother     Social History Reviewed with no changes to be made today.   Outpatient Medications Prior to Visit  Medication Sig Dispense Refill   amLODipine  (NORVASC ) 10 MG tablet Take 1 tablet (10 mg total) by mouth daily. 90 tablet 1   atorvastatin  (LIPITOR) 20 MG tablet Take 1 tablet (20 mg total) by mouth daily. 90 tablet 1   Blood Pressure Monitor DEVI Please provide patient with insurance approved blood pressure monitor. ICD10 I10.0 (Patient not taking: Reported on 04/13/2023) 1 each 0   famotidine  (PEPCID ) 20 MG tablet TAKE 1 TABLET (20 MG TOTAL) BY MOUTH 2 (TWO) TIMES DAILY. TAKE IN MORNING AND EVENING (AM+PM) 60 tablet 0   valsartan  (DIOVAN ) 40 MG tablet Take 1 tablet (40 mg total) by mouth daily. 90 tablet 1   No facility-administered medications prior to visit.    No Known Allergies     Objective:    Resp 19   Ht 4' 11 (1.499 m)   SpO2 98%   BMI 23.79 kg/m  Wt Readings from Last 3 Encounters:  04/13/23 117 lb 12.8 oz (53.4 kg)  08/25/22 118 lb (53.5 kg)  04/07/22 121 lb (54.9 kg)    Physical Exam Constitutional:      Appearance: She is well-developed.  HENT:  Head: Normocephalic and atraumatic.     Right Ear: Hearing, tympanic membrane, ear canal and external ear normal.     Left Ear: Hearing, tympanic membrane, ear canal and external ear normal.     Nose: Nose normal.     Right Turbinates: Not enlarged.     Left Turbinates: Not enlarged.     Mouth/Throat:     Lips: Pink.     Mouth: Mucous membranes are moist.     Dentition: No dental tenderness, gingival swelling, dental abscesses or gum lesions.     Pharynx:  No oropharyngeal exudate.   Eyes:     General: No scleral icterus.       Right eye: No discharge.     Extraocular Movements: Extraocular movements intact.     Conjunctiva/sclera: Conjunctivae normal.     Pupils: Pupils are equal, round, and reactive to light.   Neck:     Thyroid: No thyromegaly.     Trachea: No tracheal deviation.   Cardiovascular:     Rate and Rhythm: Normal rate and regular rhythm.     Heart sounds: Normal heart sounds. No murmur heard.    No friction rub.  Pulmonary:     Effort: Pulmonary effort is normal. No accessory muscle usage or respiratory distress.     Breath sounds: Normal breath sounds. No decreased breath sounds, wheezing, rhonchi or rales.  Abdominal:     General: Bowel sounds are normal. There is no distension.     Palpations: Abdomen is soft. There is no mass.     Tenderness: There is no abdominal tenderness. There is no right CVA tenderness, left CVA tenderness, guarding or rebound.     Hernia: No hernia is present.   Musculoskeletal:        General: No tenderness or deformity. Normal range of motion.     Cervical back: Normal range of motion and neck supple.  Lymphadenopathy:     Cervical: No cervical adenopathy.   Skin:    General: Skin is warm and dry.     Findings: No erythema.   Neurological:     Mental Status: She is alert and oriented to person, place, and time.     Cranial Nerves: No cranial nerve deficit.     Motor: Motor function is intact.     Coordination: Coordination is intact. Coordination normal.     Gait: Gait is intact.     Deep Tendon Reflexes:     Reflex Scores:      Patellar reflexes are 1+ on the right side and 1+ on the left side.  Psychiatric:        Attention and Perception: Attention normal.        Mood and Affect: Mood normal.        Speech: Speech normal.        Behavior: Behavior normal.        Thought Content: Thought content normal.        Judgment: Judgment normal.          Patient has been  counseled extensively about nutrition and exercise as well as the importance of adherence with medications and regular follow-up. The patient was given clear instructions to go to ER or return to medical center if symptoms don't improve, worsen or new problems develop. The patient verbalized understanding.   Follow-up: No follow-ups on file.   Haze LELON Servant, FNP-BC [facility] [place], [place] [phone]   07/15/2023, 2:42 PM

## 2023-07-16 LAB — CBC WITH DIFFERENTIAL/PLATELET
Basophils Absolute: 0 10*3/uL (ref 0.0–0.2)
Basos: 1 %
EOS (ABSOLUTE): 0 10*3/uL (ref 0.0–0.4)
Eos: 1 %
Hematocrit: 34.8 % (ref 34.0–46.6)
Hemoglobin: 11.2 g/dL (ref 11.1–15.9)
Immature Grans (Abs): 0 10*3/uL (ref 0.0–0.1)
Immature Granulocytes: 0 %
Lymphocytes Absolute: 2.1 10*3/uL (ref 0.7–3.1)
Lymphs: 40 %
MCH: 27.5 pg (ref 26.6–33.0)
MCHC: 32.2 g/dL (ref 31.5–35.7)
MCV: 86 fL (ref 79–97)
Monocytes Absolute: 0.3 10*3/uL (ref 0.1–0.9)
Monocytes: 6 %
Neutrophils Absolute: 2.8 10*3/uL (ref 1.4–7.0)
Neutrophils: 52 %
Platelets: 395 10*3/uL (ref 150–450)
RBC: 4.07 x10E6/uL (ref 3.77–5.28)
RDW: 14.1 % (ref 11.7–15.4)
WBC: 5.3 10*3/uL (ref 3.4–10.8)

## 2023-07-16 LAB — CMP14+EGFR
ALT: 12 IU/L (ref 0–32)
AST: 23 IU/L (ref 0–40)
Albumin: 4.3 g/dL (ref 3.9–4.9)
Alkaline Phosphatase: 79 IU/L (ref 44–121)
BUN/Creatinine Ratio: 10 — ABNORMAL LOW (ref 12–28)
BUN: 8 mg/dL (ref 8–27)
Bilirubin Total: 0.6 mg/dL (ref 0.0–1.2)
CO2: 20 mmol/L (ref 20–29)
Calcium: 9.3 mg/dL (ref 8.7–10.3)
Chloride: 104 mmol/L (ref 96–106)
Creatinine, Ser: 0.84 mg/dL (ref 0.57–1.00)
Globulin, Total: 2.9 g/dL (ref 1.5–4.5)
Glucose: 73 mg/dL (ref 70–99)
Potassium: 3.9 mmol/L (ref 3.5–5.2)
Sodium: 141 mmol/L (ref 134–144)
Total Protein: 7.2 g/dL (ref 6.0–8.5)
eGFR: 75 mL/min/{1.73_m2} (ref >59)

## 2023-07-16 LAB — LIPID PANEL
Chol/HDL Ratio: 2.6 ratio (ref 0.0–4.4)
Cholesterol, Total: 151 mg/dL (ref 100–199)
HDL: 57 mg/dL (ref >39)
LDL Chol Calc (NIH): 81 mg/dL (ref 0–99)
Triglycerides: 67 mg/dL (ref 0–149)
VLDL Cholesterol Cal: 13 mg/dL (ref 5–40)

## 2023-07-23 ENCOUNTER — Ambulatory Visit: Payer: Self-pay | Admitting: Nurse Practitioner

## 2023-09-15 ENCOUNTER — Other Ambulatory Visit: Payer: Self-pay | Admitting: Nurse Practitioner

## 2023-09-15 DIAGNOSIS — K219 Gastro-esophageal reflux disease without esophagitis: Secondary | ICD-10-CM

## 2023-10-03 ENCOUNTER — Telehealth: Payer: Self-pay | Admitting: Nurse Practitioner

## 2023-10-03 NOTE — Telephone Encounter (Signed)
 Contacted pt left vm to resch appt pcp will not be In the office

## 2023-10-24 ENCOUNTER — Telehealth: Payer: Self-pay | Admitting: Nurse Practitioner

## 2023-10-24 NOTE — Telephone Encounter (Signed)
 3rd attempt reach out to the pt lvm to resch appt pcp not in the office (appt cancel) called both numbers

## 2023-11-18 ENCOUNTER — Ambulatory Visit: Admitting: Nurse Practitioner

## 2023-11-21 ENCOUNTER — Other Ambulatory Visit: Payer: Self-pay | Admitting: Family Medicine

## 2023-11-21 ENCOUNTER — Other Ambulatory Visit: Payer: Self-pay | Admitting: Nurse Practitioner

## 2023-11-21 ENCOUNTER — Encounter: Payer: Self-pay | Admitting: Nurse Practitioner

## 2023-11-21 ENCOUNTER — Ambulatory Visit: Attending: Nurse Practitioner | Admitting: Nurse Practitioner

## 2023-11-21 VITALS — BP 101/66 | HR 92 | Resp 19 | Ht 59.0 in | Wt 119.4 lb

## 2023-11-21 DIAGNOSIS — M810 Age-related osteoporosis without current pathological fracture: Secondary | ICD-10-CM | POA: Insufficient documentation

## 2023-11-21 DIAGNOSIS — Z23 Encounter for immunization: Secondary | ICD-10-CM | POA: Diagnosis not present

## 2023-11-21 DIAGNOSIS — Z1211 Encounter for screening for malignant neoplasm of colon: Secondary | ICD-10-CM

## 2023-11-21 DIAGNOSIS — E559 Vitamin D deficiency, unspecified: Secondary | ICD-10-CM | POA: Diagnosis not present

## 2023-11-21 DIAGNOSIS — I10 Essential (primary) hypertension: Secondary | ICD-10-CM | POA: Insufficient documentation

## 2023-11-21 DIAGNOSIS — K219 Gastro-esophageal reflux disease without esophagitis: Secondary | ICD-10-CM

## 2023-11-21 DIAGNOSIS — E785 Hyperlipidemia, unspecified: Secondary | ICD-10-CM

## 2023-11-21 MED ORDER — CALCIUM-VITAMIN D-VITAMIN K 600-1000-90 MG-UNT-MCG PO TABS: 2.0000 | ORAL_TABLET | Freq: Every day | ORAL | 3 refills | Status: AC

## 2023-11-21 NOTE — Progress Notes (Signed)
 Assessment & Plan:  [name] was seen today for hypertension.  Diagnoses and all orders for this visit:  Osteoporosis without current pathological fracture, unspecified osteoporosis type -     Calcium -Vitamin D -Vitamin K [phone] MG-UNT-MCG TABS; Take 2 tablets by mouth daily. Diagnosed with decreased bone density and increased fracture risk. Discussed treatment options and medication risks. Emphasized fall prevention. - Started calcium  and vitamin D  supplementation at highest doses. - Ordered bone density test in six months. - Provided literature on osteoporosis medications. - Sent prescription for calcium  and vitamin D  to [facility]. - Advised on fall prevention measures.  Primary hypertension -     CMP14+EGFR Continue all antihypertensives as prescribed.  Reminded to bring in blood pressure log for follow  up appointment.  RECOMMENDATIONS: DASH/Mediterranean Diets are healthier choices for HTN.    Need for influenza vaccination -     Flu vaccine HIGH DOSE PF(Fluzone Trivalent)  Colon cancer screening -     Fecal occult blood, imunochemical  Vitamin D  deficiency disease -     VITAMIN D  25 Hydroxy (Vit-D Deficiency, Fractures)    Patient has been counseled on age-appropriate routine health concerns for screening and prevention. These are reviewed and up-to-date. Referrals have been placed accordingly. Immunizations are up-to-date or declined.    Subjective:   Chief Complaint  Patient presents with   Hypertension    Ashley Pena 68 y.o. female presents to office today accompanied by her granddaughter   She has a past medical history of Hypertension, osteoporosis.   History of Present Illness Ashley Pena is a 68 year old female with osteoporosis who presents for follow-up to HTN   She has a history of osteoporosis, diagnosed last year following a bone density test Unfortunately despite calling her and sending a letter we were unable to reach her to discuss  her results. She is not currently taking any vitamin D  or calcium  supplements. Her last vitamin D  level was 36.  She is overdue for a mammogram. Referral has been placed  HTN Blood pressure is well controlled.  BP Readings from Last 3 Encounters:  11/21/23 101/66  07/15/23 133/80  04/13/23 106/70    Review of Systems  Constitutional:  Negative for fever, malaise/fatigue and weight loss.  HENT: Negative.  Negative for nosebleeds.   Eyes: Negative.  Negative for blurred vision, double vision and photophobia.  Respiratory: Negative.  Negative for cough and shortness of breath.   Cardiovascular: Negative.  Negative for chest pain, palpitations and leg swelling.  Gastrointestinal: Negative.  Negative for heartburn, nausea and vomiting.  Musculoskeletal: Negative.  Negative for myalgias.  Neurological:  Positive for seizures. Negative for dizziness, focal weakness and headaches.  Psychiatric/Behavioral: Negative.  Negative for suicidal ideas.     Past Medical History:  Diagnosis Date   Hypertension     Past Surgical History:  Procedure Laterality Date   CESAREAN SECTION     CYSTOSCOPY N/A 08/28/2019   Procedure: CYSTOSCOPY;  Surgeon: Izell Harari, MD;  [facility];  Service: Gynecology;  Laterality: N/A;   LEEP  01/17/2019   TOTAL LAPAROSCOPIC HYSTERECTOMY WITH SALPINGECTOMY Bilateral 08/28/2019   Procedure: TOTAL LAPAROSCOPIC HYSTERECTOMY WITH SALPINGECTOMY;  Surgeon: Izell Harari, MD;  Location: [facility] OR;  Service: Gynecology;  Laterality: Bilateral;    Family History  Problem Relation Age of Onset   Hypertension Mother     Social History Reviewed with no changes to be made today.   Outpatient Medications Prior to Visit  Medication Sig Dispense Refill  valsartan  (DIOVAN ) 40 MG tablet Take 1 tablet (40 mg total) by mouth daily. 90 tablet 1   amLODipine  (NORVASC ) 10 MG tablet Take 1 tablet (10 mg total) by mouth daily. 90 tablet 1   atorvastatin  (LIPITOR) 20 MG  tablet Take 1 tablet (20 mg total) by mouth daily. 90 tablet 1   famotidine  (PEPCID ) 20 MG tablet TAKE 1 TABLET (20 MG TOTAL) BY MOUTH 2 (TWO) TIMES DAILY. TAKE IN MORNING AND EVENING (AM+PM) 180 tablet 0   Blood Pressure Monitor DEVI Please provide patient with insurance approved blood pressure monitor. ICD10 I10.0 (Patient not taking: Reported on 11/21/2023) 1 each 0   No facility-administered medications prior to visit.    No Known Allergies     Objective:    BP 101/66 (BP Location: Left Arm, Patient Position: Sitting, Cuff Size: Normal)   Pulse 92   Resp 19   Ht 4' 11 (1.499 m)   Wt 119 lb 6.4 oz (54.2 kg)   SpO2 97%   BMI 24.12 kg/m  Wt Readings from Last 3 Encounters:  11/21/23 119 lb 6.4 oz (54.2 kg)  07/15/23 115 lb 12.8 oz (52.5 kg)  04/13/23 117 lb 12.8 oz (53.4 kg)    Physical Exam Vitals and nursing note reviewed.  Constitutional:      Appearance: She is well-developed.  HENT:     Head: Normocephalic and atraumatic.  Cardiovascular:     Rate and Rhythm: Normal rate and regular rhythm.     Heart sounds: Normal heart sounds. No murmur heard.    No friction rub. No gallop.  Pulmonary:     Effort: Pulmonary effort is normal. No tachypnea or respiratory distress.     Breath sounds: Normal breath sounds. No decreased breath sounds, wheezing, rhonchi or rales.  Chest:     Chest wall: No tenderness.  Musculoskeletal:        General: Normal range of motion.     Cervical back: Normal range of motion.  Skin:    General: Skin is warm and dry.  Neurological:     Mental Status: She is alert and oriented to person, place, and time.     Coordination: Coordination normal.  Psychiatric:        Behavior: Behavior normal. Behavior is cooperative.        Thought Content: Thought content normal.        Judgment: Judgment normal.          Patient has been counseled extensively about nutrition and exercise as well as the importance of adherence with medications and  regular follow-up. The patient was given clear instructions to go to ER or return to medical center if symptoms don't improve, worsen or new problems develop. The patient verbalized understanding.   Follow-up: Return in about 3 months (around 02/21/2024).   Haze LELON Servant, FNP-BC [facility] [place], [place] [phone]   11/21/2023, 10:39 PM

## 2023-11-22 ENCOUNTER — Ambulatory Visit: Payer: Self-pay | Admitting: Nurse Practitioner

## 2023-11-22 LAB — CMP14+EGFR
ALT: 13 IU/L (ref 0–32)
AST: 21 IU/L (ref 0–40)
Albumin: 4.5 g/dL (ref 3.9–4.9)
Alkaline Phosphatase: 85 IU/L (ref 49–135)
BUN/Creatinine Ratio: 10 — ABNORMAL LOW (ref 12–28)
BUN: 9 mg/dL (ref 8–27)
Bilirubin Total: 1 mg/dL (ref 0.0–1.2)
CO2: 21 mmol/L (ref 20–29)
Calcium: 9.6 mg/dL (ref 8.7–10.3)
Chloride: 106 mmol/L (ref 96–106)
Creatinine, Ser: 0.89 mg/dL (ref 0.57–1.00)
Globulin, Total: 3.2 g/dL (ref 1.5–4.5)
Glucose: 83 mg/dL (ref 70–99)
Potassium: 4.5 mmol/L (ref 3.5–5.2)
Sodium: 141 mmol/L (ref 134–144)
Total Protein: 7.7 g/dL (ref 6.0–8.5)
eGFR: 70 mL/min/1.73 (ref >59)

## 2023-11-22 LAB — VITAMIN D 25 HYDROXY (VIT D DEFICIENCY, FRACTURES): Vit D, 25-Hydroxy: 31.3 ng/mL (ref 30.0–100.0)

## 2023-11-25 LAB — FECAL OCCULT BLOOD, IMMUNOCHEMICAL: Fecal Occult Bld: NEGATIVE

## 2023-12-27 ENCOUNTER — Other Ambulatory Visit: Payer: Self-pay | Admitting: Nurse Practitioner

## 2023-12-27 DIAGNOSIS — Z1231 Encounter for screening mammogram for malignant neoplasm of breast: Secondary | ICD-10-CM

## 2024-01-27 ENCOUNTER — Ambulatory Visit
Admission: RE | Admit: 2024-01-27 | Discharge: 2024-01-27 | Disposition: A | Source: Ambulatory Visit | Attending: Nurse Practitioner

## 2024-01-27 DIAGNOSIS — Z1231 Encounter for screening mammogram for malignant neoplasm of breast: Secondary | ICD-10-CM

## 2024-01-31 ENCOUNTER — Ambulatory Visit: Payer: Self-pay | Admitting: Nurse Practitioner

## 2024-01-31 DIAGNOSIS — R928 Other abnormal and inconclusive findings on diagnostic imaging of breast: Secondary | ICD-10-CM

## 2024-01-31 DIAGNOSIS — M810 Age-related osteoporosis without current pathological fracture: Secondary | ICD-10-CM

## 2024-02-01 ENCOUNTER — Encounter: Payer: Self-pay | Admitting: Nurse Practitioner

## 2024-02-04 ENCOUNTER — Emergency Department (HOSPITAL_COMMUNITY)
Admission: EM | Admit: 2024-02-04 | Discharge: 2024-02-05 | Disposition: A | Attending: Emergency Medicine | Admitting: Emergency Medicine

## 2024-02-04 ENCOUNTER — Emergency Department (HOSPITAL_COMMUNITY)

## 2024-02-04 ENCOUNTER — Other Ambulatory Visit: Payer: Self-pay

## 2024-02-04 ENCOUNTER — Encounter (HOSPITAL_COMMUNITY): Payer: Self-pay

## 2024-02-04 DIAGNOSIS — W01198A Fall on same level from slipping, tripping and stumbling with subsequent striking against other object, initial encounter: Secondary | ICD-10-CM | POA: Insufficient documentation

## 2024-02-04 DIAGNOSIS — S01511A Laceration without foreign body of lip, initial encounter: Secondary | ICD-10-CM | POA: Diagnosis not present

## 2024-02-04 DIAGNOSIS — S0993XA Unspecified injury of face, initial encounter: Secondary | ICD-10-CM | POA: Diagnosis present

## 2024-02-04 DIAGNOSIS — I10 Essential (primary) hypertension: Secondary | ICD-10-CM | POA: Diagnosis not present

## 2024-02-04 DIAGNOSIS — S0240DA Maxillary fracture, left side, initial encounter for closed fracture: Secondary | ICD-10-CM | POA: Diagnosis not present

## 2024-02-04 DIAGNOSIS — S02401A Maxillary fracture, unspecified, initial encounter for closed fracture: Secondary | ICD-10-CM

## 2024-02-04 MED ORDER — LIDOCAINE HCL (PF) 1 % IJ SOLN
10.0000 mL | Freq: Once | INTRAMUSCULAR | Status: AC
  Administered 2024-02-05: 10 mL
  Filled 2024-02-04: qty 10

## 2024-02-04 NOTE — ED Provider Notes (Signed)
 " [facility] Emergency Department Provider Note MRN:  [id number]  Arrival date & time: 02/05/24     Chief Complaint   Lip Laceration   History of Present Illness   Ashley Pena is a 69 y.o. year-old female with no pertinent past medical history presenting to the ED with chief complaint of lip laceration.  Tripped and fell earlier today bit her lip, hit her face on the ground.  Endorsing tooth pain, lip pain, mild neck pain.  No headache or vomiting, no loss of consciousness.  No pain to the chest or back, no abdominal pain, no injuries to the arms or legs.  Review of Systems  A thorough review of systems was obtained and all systems are negative except as noted in the HPI and PMH.   Patient's Health History    Past Medical History:  Diagnosis Date   Hypertension     Past Surgical History:  Procedure Laterality Date   CESAREAN SECTION     CYSTOSCOPY N/A 08/28/2019   Procedure: CYSTOSCOPY;  Surgeon: Izell Harari, MD;  Location: [facility];  Service: Gynecology;  Laterality: N/A;   LEEP  01/17/2019   TOTAL LAPAROSCOPIC HYSTERECTOMY WITH SALPINGECTOMY Bilateral 08/28/2019   Procedure: TOTAL LAPAROSCOPIC HYSTERECTOMY WITH SALPINGECTOMY;  Surgeon: Izell Harari, MD;  Location: [facility] OR;  Service: Gynecology;  Laterality: Bilateral;    Family History  Problem Relation Age of Onset   Hypertension Mother     Social History   Socioeconomic History   Marital status: Single    Spouse name: Not on file   Number of children: Not on file   Years of education: Not on file   Highest education level: Not on file  Occupational History   Not on file  Tobacco Use   Smoking status: Never   Smokeless tobacco: Never  Vaping Use   Vaping status: Never Used  Substance and Sexual Activity   Alcohol use: No   Drug use: No   Sexual activity: Not Currently  Other Topics Concern   Not on file  Social History Narrative   Not on file   Social Drivers of Health    Tobacco Use: Low Risk (02/04/2024)   Patient History    Smoking Tobacco Use: Never    Smokeless Tobacco Use: Never    Passive Exposure: Not on file  Financial Resource Strain: Low Risk (04/13/2023)   Overall Financial Resource Strain (CARDIA)    Difficulty of Paying Living Expenses: Not very hard  Food Insecurity: Food Insecurity Present (04/13/2023)   Hunger Vital Sign    Worried About Running Out of Food in the Last Year: Never true    Ran Out of Food in the Last Year: Sometimes true  Transportation Needs: Unmet Transportation Needs (04/07/2022)   PRAPARE - Transportation    Lack of Transportation (Medical): Yes    Lack of Transportation (Non-Medical): Yes  Physical Activity: Insufficiently Active (04/13/2023)   Exercise Vital Sign    Days of Exercise per Week: 7 days    Minutes of Exercise per Session: 20 min  Stress: No Stress Concern Present (04/13/2023)   [employer] of Occupational Health - Occupational Stress Questionnaire    Feeling of Stress : Only a little  Social Connections: Moderately Isolated (04/13/2023)   Social Connection and Isolation Panel    Frequency of Communication with Friends and Family: More than three times a week    Frequency of Social Gatherings with Friends and Family: More than three times a week  Attends Religious Services: More than 4 times per year    Active Member of Clubs or Organizations: No    Attends Banker Meetings: Never    Marital Status: Widowed  Intimate Partner Violence: Not At Risk (04/13/2023)   Humiliation, Afraid, Rape, and Kick questionnaire    Fear of Current or Ex-Partner: No    Emotionally Abused: No    Physically Abused: No    Sexually Abused: No  Depression (PHQ2-9): Medium Risk (11/21/2023)   Depression (PHQ2-9)    PHQ-2 Score: 6  Alcohol Screen: Low Risk (04/13/2023)   Alcohol Screen    Last Alcohol Screening Score (AUDIT): 0  Housing: Low Risk (04/13/2023)   Housing Stability Vital Sign    Unable to  Pay for Housing in the Last Year: No    Number of Times Moved in the Last Year: 0    Homeless in the Last Year: No  Utilities: Not At Risk (04/13/2023)   AHC Utilities    Threatened with loss of utilities: No  Health Literacy: Inadequate Health Literacy (04/13/2023)   B1300 Health Literacy    Frequency of need for help with medical instructions: Always     Physical Exam   Vitals:   02/04/24 2000 02/04/24 2314  BP: (!) 161/98 (!) 143/88  Pulse: (!) 102 93  Resp: 16 16  Temp: 98.1 F (36.7 C) 98 F (36.7 C)  SpO2: 99% 98%    CONSTITUTIONAL: Well-appearing, NAD NEURO/PSYCH:  Alert and oriented x 3, no focal deficits EYES:  eyes equal and reactive ENT/NECK:  no LAD, no JVD CARDIO: Regular rate, well-perfused, normal S1 and S2 PULM:  CTAB no wheezing or rhonchi GI/GU:  non-distended, non-tender MSK/SPINE:  No gross deformities, no edema SKIN:  no rash, atraumatic   *Additional and/or pertinent findings included in MDM below  Diagnostic and Interventional Summary    EKG Interpretation Date/Time:    Ventricular Rate:    PR Interval:    QRS Duration:    QT Interval:    QTC Calculation:   R Axis:      Text Interpretation:         Labs Reviewed - No data to display  CT HEAD WO CONTRAST ( )  Final Result    CT CERVICAL SPINE WO CONTRAST  Final Result    CT MAXILLOFACIAL WO CONTRAST  Final Result      Medications  lidocaine  (PF) (XYLOCAINE ) 1 % injection 10 mL (10 mLs Infiltration Given 02/05/24 0048)     Procedures  /  Critical Care .Laceration Repair  Date/Time: 02/05/2024 1:12 AM  Performed by: Theadore Ozell HERO, MD Authorized by: Theadore Ozell HERO, MD   Consent:    Consent obtained:  Verbal   Consent given by:  Patient   Risks, benefits, and alternatives were discussed: yes     Risks discussed:  Infection, need for additional repair, nerve damage, poor wound healing, poor cosmetic result, pain, retained foreign body, tendon damage and vascular damage    Alternatives discussed:  No treatment Universal protocol:    Procedure explained and questions answered to patient or proxy's satisfaction: yes     Immediately prior to procedure, a time out was called: yes     Patient identity confirmed:  Verbally with patient Anesthesia:    Anesthesia method:  Local infiltration   Local anesthetic:  Lidocaine  1% w/o epi Laceration details:    Location:  Lip   Lip location:  Lower interior lip   Length (cm):  2   Depth (mm):  2 Pre-procedure details:    Preparation:  Patient was prepped and draped in usual sterile fashion Exploration:    Limited defect created (wound extended): no     Hemostasis achieved with:  Direct pressure   Wound exploration: wound explored through full range of motion and entire depth of wound visualized     Contaminated: no   Treatment:    Wound cleansed with: [place]. Skin repair:    Repair method:  Sutures   Suture size:  5-0   Suture material:  Chromic gut   Suture technique:  Simple interrupted   Number of sutures:  5 Approximation:    Approximation:  Close   Vermilion border well-aligned: yes   Repair type:    Repair type:  Simple Post-procedure details:    Dressing:  Open (no dressing)   Procedure completion:  Tolerated well, no immediate complications   ED Course and Medical Decision Making  Initial Impression and Ddx On exam patient has a lower lip laceration within the wet border.  She also has evidence of dental trauma with impaction of tooth #9.  Tenderness in this area.  Obtaining advanced imaging to evaluate for facial fractures.  Patient not having any other symptoms, lungs clear, abdomen soft, no back pain, no other evidence of significant traumatic injury.  Past medical/surgical history that increases complexity of ED encounter: None  Interpretation of Diagnostics I personally reviewed the CT imaging and my interpretation is as follows: No intracranial bleeding.  Evidence of maxillary  fracture    Patient Reassessment and Ultimate Disposition/Management     Facial injury discussed with Dr. Lowery of facial plastics, this injury can be followed up within the outpatient setting.  Laceration repaired as described above, appropriate for discharge.  Patient management required discussion with the following services or consulting groups:  ENT/Plastic Surgery  Complexity of Problems Addressed Acute illness or injury that poses threat of life of bodily function  Additional Data Reviewed and Analyzed Further history obtained from: Further history from spouse/family member  Additional Factors Impacting ED Encounter Risk Prescriptions and Consideration of hospitalization  Ozell HERO. Theadore, MD [facility] [email or web address]  Final Clinical Impressions(s) / ED Diagnoses     ICD-10-CM   1. Closed fracture of maxilla, unspecified laterality, initial encounter (HCC)  S02.401A     2. Lip laceration, initial encounter  S01.511A       ED Discharge Orders          Ordered    HYDROcodone -acetaminophen  (NORCO/VICODIN) 5-325 MG tablet  Every 4 hours PRN        02/05/24 0111             Discharge Instructions Discussed with and Provided to Patient:     Discharge Instructions      You were evaluated in the Emergency Department and after careful evaluation, we did not find any emergent condition requiring admission or further testing in the hospital.  We repaired your lip laceration here in the emergency department.  These are absorbable sutures that do not need to be removed.  You have a broken bone in your face, specifically the maxillary bone.  Recommend follow-up with Dr. Shellia him for further management of this.  Recommend Tylenol  or ibuprofen  at home for discomfort.  For more significant pain keeping you from sleeping you can use the Norco tablets provided.  Please return to the Emergency Department if you  experience any worsening of  your condition.   Thank you for allowing us  to be a part of your care.       Theadore Ozell HERO, MD 02/05/24 [phone]  "

## 2024-02-04 NOTE — ED Triage Notes (Signed)
 Pt reports tripping and falling earlier today. Pt reports that she bit her lower lip. Laceration noted to area. No bleeding noted. No blood thinners. No LOC.

## 2024-02-05 MED ORDER — HYDROCODONE-ACETAMINOPHEN 5-325 MG PO TABS
1.0000 | ORAL_TABLET | ORAL | 0 refills | Status: DC | PRN
  Filled 2024-02-05 – 2024-02-06 (×2): qty 6, 1d supply, fill #0

## 2024-02-05 NOTE — Discharge Instructions (Addendum)
 You were evaluated in the Emergency Department and after careful evaluation, we did not find any emergent condition requiring admission or further testing in the hospital.  We repaired your lip laceration here in the emergency department.  These are absorbable sutures that do not need to be removed.  You have a broken bone in your face, specifically the maxillary bone.  Recommend follow-up with Dr. Shellia him for further management of this.  Recommend Tylenol  or ibuprofen  at home for discomfort.  For more significant pain keeping you from sleeping you can use the Norco tablets provided.  Please return to the Emergency Department if you experience any worsening of your condition.   Thank you for allowing us  to be a part of your care.

## 2024-02-06 ENCOUNTER — Other Ambulatory Visit: Payer: Self-pay

## 2024-02-08 ENCOUNTER — Other Ambulatory Visit: Payer: Self-pay | Admitting: Nurse Practitioner

## 2024-02-08 ENCOUNTER — Other Ambulatory Visit: Payer: Self-pay

## 2024-02-08 NOTE — Telephone Encounter (Signed)
 Requested medication (s) are due for refill today: yes  Requested medication (s) are on the active medication list: yes  Last refill:  02/05/24  Future visit scheduled: yes  Notes to clinic:  Unable to refill per protocol, cannot delegate.last refill by another provider.       Requested Prescriptions  Pending Prescriptions Disp Refills   HYDROcodone -acetaminophen  (NORCO/VICODIN) 5-325 MG tablet 6 tablet 0    Sig: Take 1 tablet by mouth every 4 (four) hours as needed.     Not Delegated - Analgesics:  Opioid Agonist Combinations Failed - 02/08/2024  4:01 PM      Failed - This refill cannot be delegated      Failed - Urine Drug Screen completed in last 360 days      Passed - Valid encounter within last 3 months    Recent Outpatient Visits           2 months ago Osteoporosis without current pathological fracture, unspecified osteoporosis type   [facility] [place] Theotis Haze ORN, NP   6 months ago Encounter for annual physical exam   [facility] [place] [facility] Dept Of [place]. [facility] Theotis Haze ORN, NP   10 months ago Essential hypertension   [facility] Dept Of [place]. [facility] Theotis Haze ORN, NP   1 year ago Primary hypertension   [facility] [place] - [facility] Dept Of [place]. [facility] Theotis Haze ORN, NP   2 years ago Primary hypertension   [facility] [place] - [facility] Dept Of . [facility] Theotis Haze ORN, [place]

## 2024-02-09 ENCOUNTER — Ambulatory Visit: Payer: Self-pay

## 2024-02-09 ENCOUNTER — Other Ambulatory Visit: Payer: Self-pay | Admitting: Nurse Practitioner

## 2024-02-09 ENCOUNTER — Other Ambulatory Visit: Payer: Self-pay

## 2024-02-09 DIAGNOSIS — R519 Headache, unspecified: Secondary | ICD-10-CM

## 2024-02-09 MED ORDER — ACETAMINOPHEN-CODEINE 300-30 MG PO TABS
1.0000 | ORAL_TABLET | Freq: Three times a day (TID) | ORAL | 0 refills | Status: DC | PRN
  Filled 2024-02-09: qty 30, 10d supply, fill #0

## 2024-02-09 NOTE — Telephone Encounter (Signed)
 Patient st daughter aware of response from provider and voiced understanding.

## 2024-02-09 NOTE — Telephone Encounter (Signed)
 Maxilla fracture 1/17 post fall. Ran out of norco from ED and requesting Rx for analgesia. Pt reporting increased pain and difficulty eating/drinking. Please call the pt back if she needs to be seen in office or if an Rx can be sent.  FYI Only or Action Required?: Action required by provider: request for appointment.  Patient was last seen in primary care on 11/21/2023 by Theotis Haze ORN, NP.  Called Nurse Triage reporting Fall and Facial Injury.  Symptoms began several days ago.  Interventions attempted: Prescription medications: ran out of norco.  Symptoms are: gradually worsening.  Triage Disposition: See HCP Within 4 Hours (Or PCP Triage)  Patient/caregiver understands and will follow disposition?: Yes   She fell on 02/04/24 and went to emergency room. She had Closed fracture of maxilla, unspecified laterality, initial encounter (HCC) (notes are in chart) She is having pain eating and swallowing anything. Pain level is a 8    Reason for Disposition  [1] SEVERE pain (e.g., excruciating) AND [2] not improved 2 hours after pain medicine/ice packs  Answer Assessment - Initial Assessment Questions 1. MECHANISM: How did the injury happen?      Fall on 1/17  3. LOCATION: What part of the face is injured?     Closed fracture of maxilla  4. APPEARANCE of INJURY: What does the face look like?     Just swollen, hard for her like drinking and chewing.   5. BLEEDING: Is it bleeding now? If Yes, ask: Is it difficult to stop?     Denies  6. PAIN: Is there pain? If Yes, ask: How bad is the pain?  (Scale 0-10; or none, mild, moderate, severe)     8/10  9. OTHER SYMPTOMS: Do you have any other symptoms? (e.g., neck pain, headache, loss of consciousness)     Denies  Protocols used: Face Injury-A-AH

## 2024-02-09 NOTE — Telephone Encounter (Signed)
 Pain medication sent. Not norco

## 2024-02-10 ENCOUNTER — Other Ambulatory Visit: Payer: Self-pay | Admitting: Nurse Practitioner

## 2024-02-10 ENCOUNTER — Ambulatory Visit
Admission: RE | Admit: 2024-02-10 | Discharge: 2024-02-10 | Disposition: A | Source: Ambulatory Visit | Attending: Nurse Practitioner

## 2024-02-10 ENCOUNTER — Other Ambulatory Visit

## 2024-02-10 ENCOUNTER — Encounter

## 2024-02-10 ENCOUNTER — Ambulatory Visit: Admitting: Student

## 2024-02-10 ENCOUNTER — Ambulatory Visit: Admission: RE | Admit: 2024-02-10 | Source: Ambulatory Visit

## 2024-02-10 ENCOUNTER — Encounter: Payer: Self-pay | Admitting: Student

## 2024-02-10 VITALS — BP 144/87 | HR 95 | Wt 123.2 lb

## 2024-02-10 DIAGNOSIS — W19XXXA Unspecified fall, initial encounter: Secondary | ICD-10-CM | POA: Diagnosis not present

## 2024-02-10 DIAGNOSIS — R928 Other abnormal and inconclusive findings on diagnostic imaging of breast: Secondary | ICD-10-CM

## 2024-02-10 DIAGNOSIS — S0240DA Maxillary fracture, left side, initial encounter for closed fracture: Secondary | ICD-10-CM

## 2024-02-10 NOTE — Progress Notes (Signed)
 Referring Provider Theotis Haze ORN, NP [address] [place] [address] [place],  [place] [address]   CC:  Chief Complaint  Patient presents with   Follow-up      Ashley Pena is an 69 y.o. female.  HPI: Patient is a 69 year old female who presents to the clinic today for facial fracture status post fall.  Per chart review, patient presented to the ER on 02/04/2024 after a fall.  CT of the face showed fracture of the left paramedian anterior maxilla at the root of tooth 9 with anterior displacement of the tooth.  Today, patient is accompanied by family members at bedside.  Patient reports that she is doing okay.  She reports she has some pain near her tooth.  Otherwise denies any facial pain.  She patient's family reports that she saw a dentist this past week and the dentist is having her wait a little bit for the swelling to go down prior to fixing her tooth.  Review of Systems General: Does not report infectious symptoms.  Physical Exam    02/10/2024    8:13 AM 02/05/2024    1:30 AM 02/04/2024   11:14 PM  Vitals with BMI  Weight 123 lbs 3 oz    BMI 24.87    Systolic 144 148 856  Diastolic 87 85 88  Pulse 95 83 93    General:  No acute distress,  Alert and oriented, Non-Toxic, Normal speech and affect On exam, patient sitting upright in no acute distress.  Patient is able to open and close her mouth.  Patient's front tooth is pushed into her gum.  There is no surrounding bleeding.  No significant tenderness to palpation.  No obstruction noted.  No signs of infection.  Assessment/Plan  Closed fracture of left side of maxilla, initial encounter Ashley Pena)   Dr. Montorfano had the opportunity to evaluate the patient today as well as review her images.  Recommended that patient continue to follow-up with her dentist and continue with soft diet.  Patient and her family expressed understanding.  We will have the patient follow back up with Dr. Lowery in about 4 weeks.  Instructed the  patient to call with any questions or concerns.  Pictures were obtained of the patient and placed in the chart with the patient's or guardian's permission.   Ashley Pena 02/10/2024, 10:39 AM

## 2024-02-12 ENCOUNTER — Ambulatory Visit: Payer: Self-pay | Admitting: Nurse Practitioner

## 2024-02-13 ENCOUNTER — Other Ambulatory Visit: Payer: Self-pay

## 2024-02-21 ENCOUNTER — Encounter: Payer: Self-pay | Admitting: Nurse Practitioner

## 2024-02-21 ENCOUNTER — Ambulatory Visit: Attending: Nurse Practitioner | Admitting: Nurse Practitioner

## 2024-02-21 VITALS — BP 142/90 | HR 74 | Ht 59.0 in | Wt 121.0 lb

## 2024-02-21 DIAGNOSIS — I10 Essential (primary) hypertension: Secondary | ICD-10-CM | POA: Diagnosis not present

## 2024-02-21 DIAGNOSIS — R296 Repeated falls: Secondary | ICD-10-CM | POA: Insufficient documentation

## 2024-02-21 DIAGNOSIS — Z79899 Other long term (current) drug therapy: Secondary | ICD-10-CM | POA: Insufficient documentation

## 2024-02-21 DIAGNOSIS — R519 Headache, unspecified: Secondary | ICD-10-CM | POA: Insufficient documentation

## 2024-02-21 DIAGNOSIS — M810 Age-related osteoporosis without current pathological fracture: Secondary | ICD-10-CM | POA: Insufficient documentation

## 2024-02-21 DIAGNOSIS — K219 Gastro-esophageal reflux disease without esophagitis: Secondary | ICD-10-CM | POA: Diagnosis not present

## 2024-02-21 MED ORDER — ACETAMINOPHEN-CODEINE 300-30 MG PO TABS: 1.0000 | ORAL_TABLET | Freq: Two times a day (BID) | ORAL | 0 refills | Status: AC | PRN

## 2024-02-21 MED ORDER — FAMOTIDINE 20 MG PO TABS: 20.0000 mg | ORAL_TABLET | Freq: Every day | ORAL | 1 refills | Status: AC

## 2024-02-24 ENCOUNTER — Other Ambulatory Visit: Payer: Self-pay

## 2024-03-06 ENCOUNTER — Ambulatory Visit: Admitting: Plastic Surgery

## 2024-05-21 ENCOUNTER — Ambulatory Visit: Payer: Self-pay | Admitting: Nurse Practitioner

## 2024-08-14 ENCOUNTER — Encounter

## 2024-08-14 ENCOUNTER — Other Ambulatory Visit
